# Patient Record
Sex: Female | Born: 1960 | Race: White | Hispanic: No | Marital: Married | Smoking: Never smoker
Health system: Southern US, Community
[De-identification: ages and names within clinical notes are randomized; demographics above are authoritative.]

## PROBLEM LIST (undated history)

## (undated) DIAGNOSIS — E785 Hyperlipidemia, unspecified: Secondary | ICD-10-CM

## (undated) HISTORY — DX: Hyperlipidemia, unspecified: E78.5

---

## 2011-01-18 ENCOUNTER — Other Ambulatory Visit (HOSPITAL_BASED_OUTPATIENT_CLINIC_OR_DEPARTMENT_OTHER): Payer: Self-pay | Admitting: Family Medicine

## 2011-01-18 DIAGNOSIS — R599 Enlarged lymph nodes, unspecified: Secondary | ICD-10-CM

## 2011-01-19 ENCOUNTER — Ambulatory Visit (HOSPITAL_BASED_OUTPATIENT_CLINIC_OR_DEPARTMENT_OTHER)
Admission: RE | Admit: 2011-01-19 | Discharge: 2011-01-19 | Disposition: A | Discharge status: Home / Self Care | Payer: Private Health Insurance - Indemnity | Source: Ambulatory Visit | Attending: Family Medicine | Admitting: Family Medicine

## 2011-01-19 DIAGNOSIS — E049 Nontoxic goiter, unspecified: Secondary | ICD-10-CM | POA: Insufficient documentation

## 2011-01-19 DIAGNOSIS — R220 Localized swelling, mass and lump, head: Secondary | ICD-10-CM

## 2011-01-19 DIAGNOSIS — R599 Enlarged lymph nodes, unspecified: Secondary | ICD-10-CM | POA: Insufficient documentation

## 2011-01-19 DIAGNOSIS — E042 Nontoxic multinodular goiter: Secondary | ICD-10-CM

## 2018-10-07 DIAGNOSIS — G35 Multiple sclerosis: Secondary | ICD-10-CM | POA: Diagnosis not present

## 2018-10-15 DIAGNOSIS — M5417 Radiculopathy, lumbosacral region: Secondary | ICD-10-CM | POA: Diagnosis not present

## 2018-10-15 DIAGNOSIS — G35 Multiple sclerosis: Secondary | ICD-10-CM | POA: Diagnosis not present

## 2018-10-15 DIAGNOSIS — Z79899 Other long term (current) drug therapy: Secondary | ICD-10-CM | POA: Diagnosis not present

## 2018-10-15 DIAGNOSIS — G43909 Migraine, unspecified, not intractable, without status migrainosus: Secondary | ICD-10-CM | POA: Diagnosis not present

## 2018-10-15 DIAGNOSIS — G2581 Restless legs syndrome: Secondary | ICD-10-CM | POA: Diagnosis not present

## 2018-10-15 DIAGNOSIS — G500 Trigeminal neuralgia: Secondary | ICD-10-CM | POA: Diagnosis not present

## 2018-10-23 DIAGNOSIS — Z8041 Family history of malignant neoplasm of ovary: Secondary | ICD-10-CM | POA: Diagnosis not present

## 2018-10-23 DIAGNOSIS — Z90710 Acquired absence of both cervix and uterus: Secondary | ICD-10-CM | POA: Diagnosis not present

## 2018-10-23 DIAGNOSIS — Z01419 Encounter for gynecological examination (general) (routine) without abnormal findings: Secondary | ICD-10-CM | POA: Diagnosis not present

## 2018-10-23 DIAGNOSIS — Z808 Family history of malignant neoplasm of other organs or systems: Secondary | ICD-10-CM | POA: Diagnosis not present

## 2018-11-05 DIAGNOSIS — G35 Multiple sclerosis: Secondary | ICD-10-CM | POA: Diagnosis not present

## 2018-11-17 DIAGNOSIS — H2513 Age-related nuclear cataract, bilateral: Secondary | ICD-10-CM | POA: Diagnosis not present

## 2018-11-17 DIAGNOSIS — H10413 Chronic giant papillary conjunctivitis, bilateral: Secondary | ICD-10-CM | POA: Diagnosis not present

## 2018-11-17 DIAGNOSIS — G35 Multiple sclerosis: Secondary | ICD-10-CM | POA: Diagnosis not present

## 2018-12-03 DIAGNOSIS — G35 Multiple sclerosis: Secondary | ICD-10-CM | POA: Diagnosis not present

## 2018-12-23 DIAGNOSIS — D229 Melanocytic nevi, unspecified: Secondary | ICD-10-CM | POA: Diagnosis not present

## 2018-12-23 DIAGNOSIS — L570 Actinic keratosis: Secondary | ICD-10-CM | POA: Diagnosis not present

## 2018-12-29 DIAGNOSIS — Z20828 Contact with and (suspected) exposure to other viral communicable diseases: Secondary | ICD-10-CM | POA: Diagnosis not present

## 2019-01-05 DIAGNOSIS — Z20828 Contact with and (suspected) exposure to other viral communicable diseases: Secondary | ICD-10-CM | POA: Diagnosis not present

## 2019-01-14 DIAGNOSIS — G35 Multiple sclerosis: Secondary | ICD-10-CM | POA: Diagnosis not present

## 2019-01-21 DIAGNOSIS — G2581 Restless legs syndrome: Secondary | ICD-10-CM | POA: Diagnosis not present

## 2019-01-21 DIAGNOSIS — G43909 Migraine, unspecified, not intractable, without status migrainosus: Secondary | ICD-10-CM | POA: Diagnosis not present

## 2019-01-21 DIAGNOSIS — Z79899 Other long term (current) drug therapy: Secondary | ICD-10-CM | POA: Diagnosis not present

## 2019-01-21 DIAGNOSIS — G35 Multiple sclerosis: Secondary | ICD-10-CM | POA: Diagnosis not present

## 2019-01-21 DIAGNOSIS — M5412 Radiculopathy, cervical region: Secondary | ICD-10-CM | POA: Diagnosis not present

## 2019-02-11 DIAGNOSIS — G35 Multiple sclerosis: Secondary | ICD-10-CM | POA: Diagnosis not present

## 2019-02-23 DIAGNOSIS — K219 Gastro-esophageal reflux disease without esophagitis: Secondary | ICD-10-CM | POA: Diagnosis not present

## 2019-02-23 DIAGNOSIS — K581 Irritable bowel syndrome with constipation: Secondary | ICD-10-CM | POA: Diagnosis not present

## 2019-02-23 DIAGNOSIS — R140 Abdominal distension (gaseous): Secondary | ICD-10-CM | POA: Diagnosis not present

## 2019-03-11 DIAGNOSIS — G35 Multiple sclerosis: Secondary | ICD-10-CM | POA: Diagnosis not present

## 2019-04-01 DIAGNOSIS — G35 Multiple sclerosis: Secondary | ICD-10-CM | POA: Diagnosis not present

## 2019-04-07 DIAGNOSIS — L570 Actinic keratosis: Secondary | ICD-10-CM | POA: Diagnosis not present

## 2019-04-13 DIAGNOSIS — Z03818 Encounter for observation for suspected exposure to other biological agents ruled out: Secondary | ICD-10-CM | POA: Diagnosis not present

## 2019-04-17 DIAGNOSIS — Z03818 Encounter for observation for suspected exposure to other biological agents ruled out: Secondary | ICD-10-CM | POA: Diagnosis not present

## 2019-05-04 DIAGNOSIS — Z8371 Family history of colonic polyps: Secondary | ICD-10-CM | POA: Diagnosis not present

## 2019-05-04 DIAGNOSIS — K581 Irritable bowel syndrome with constipation: Secondary | ICD-10-CM | POA: Diagnosis not present

## 2019-05-04 DIAGNOSIS — K219 Gastro-esophageal reflux disease without esophagitis: Secondary | ICD-10-CM | POA: Diagnosis not present

## 2019-05-04 DIAGNOSIS — G35 Multiple sclerosis: Secondary | ICD-10-CM | POA: Diagnosis not present

## 2019-05-06 DIAGNOSIS — G35 Multiple sclerosis: Secondary | ICD-10-CM | POA: Diagnosis not present

## 2019-05-14 DIAGNOSIS — Z79899 Other long term (current) drug therapy: Secondary | ICD-10-CM | POA: Diagnosis not present

## 2019-05-14 DIAGNOSIS — G35 Multiple sclerosis: Secondary | ICD-10-CM | POA: Diagnosis not present

## 2019-05-14 DIAGNOSIS — G5603 Carpal tunnel syndrome, bilateral upper limbs: Secondary | ICD-10-CM | POA: Diagnosis not present

## 2019-05-14 DIAGNOSIS — E538 Deficiency of other specified B group vitamins: Secondary | ICD-10-CM | POA: Diagnosis not present

## 2019-05-14 DIAGNOSIS — D649 Anemia, unspecified: Secondary | ICD-10-CM | POA: Diagnosis not present

## 2019-05-14 DIAGNOSIS — M5412 Radiculopathy, cervical region: Secondary | ICD-10-CM | POA: Diagnosis not present

## 2019-05-14 DIAGNOSIS — M5417 Radiculopathy, lumbosacral region: Secondary | ICD-10-CM | POA: Diagnosis not present

## 2019-05-26 DIAGNOSIS — K219 Gastro-esophageal reflux disease without esophagitis: Secondary | ICD-10-CM | POA: Diagnosis not present

## 2019-05-26 DIAGNOSIS — G35 Multiple sclerosis: Secondary | ICD-10-CM | POA: Diagnosis not present

## 2019-05-26 DIAGNOSIS — G43909 Migraine, unspecified, not intractable, without status migrainosus: Secondary | ICD-10-CM | POA: Diagnosis not present

## 2019-05-26 DIAGNOSIS — E7800 Pure hypercholesterolemia, unspecified: Secondary | ICD-10-CM | POA: Diagnosis not present

## 2019-06-10 DIAGNOSIS — H10413 Chronic giant papillary conjunctivitis, bilateral: Secondary | ICD-10-CM | POA: Diagnosis not present

## 2019-06-10 DIAGNOSIS — H43391 Other vitreous opacities, right eye: Secondary | ICD-10-CM | POA: Diagnosis not present

## 2019-06-10 DIAGNOSIS — G35 Multiple sclerosis: Secondary | ICD-10-CM | POA: Diagnosis not present

## 2019-06-10 DIAGNOSIS — H2513 Age-related nuclear cataract, bilateral: Secondary | ICD-10-CM | POA: Diagnosis not present

## 2019-06-21 DIAGNOSIS — Z20828 Contact with and (suspected) exposure to other viral communicable diseases: Secondary | ICD-10-CM | POA: Diagnosis not present

## 2019-06-22 DIAGNOSIS — Z0000 Encounter for general adult medical examination without abnormal findings: Secondary | ICD-10-CM | POA: Diagnosis not present

## 2019-06-23 DIAGNOSIS — E7800 Pure hypercholesterolemia, unspecified: Secondary | ICD-10-CM | POA: Diagnosis not present

## 2019-06-24 DIAGNOSIS — Z8041 Family history of malignant neoplasm of ovary: Secondary | ICD-10-CM | POA: Diagnosis not present

## 2019-06-24 DIAGNOSIS — Z79899 Other long term (current) drug therapy: Secondary | ICD-10-CM | POA: Diagnosis not present

## 2019-06-24 DIAGNOSIS — D649 Anemia, unspecified: Secondary | ICD-10-CM | POA: Diagnosis not present

## 2019-06-24 DIAGNOSIS — G35 Multiple sclerosis: Secondary | ICD-10-CM | POA: Diagnosis not present

## 2019-07-20 DIAGNOSIS — G35 Multiple sclerosis: Secondary | ICD-10-CM | POA: Diagnosis not present

## 2019-07-21 DIAGNOSIS — H10413 Chronic giant papillary conjunctivitis, bilateral: Secondary | ICD-10-CM | POA: Diagnosis not present

## 2019-07-21 DIAGNOSIS — G35 Multiple sclerosis: Secondary | ICD-10-CM | POA: Diagnosis not present

## 2019-07-21 DIAGNOSIS — H43391 Other vitreous opacities, right eye: Secondary | ICD-10-CM | POA: Diagnosis not present

## 2019-07-21 DIAGNOSIS — H2513 Age-related nuclear cataract, bilateral: Secondary | ICD-10-CM | POA: Diagnosis not present

## 2019-07-22 DIAGNOSIS — D649 Anemia, unspecified: Secondary | ICD-10-CM | POA: Diagnosis not present

## 2019-08-17 DIAGNOSIS — G35 Multiple sclerosis: Secondary | ICD-10-CM | POA: Diagnosis not present

## 2019-08-18 DIAGNOSIS — Z8041 Family history of malignant neoplasm of ovary: Secondary | ICD-10-CM | POA: Diagnosis not present

## 2019-08-21 DIAGNOSIS — G5603 Carpal tunnel syndrome, bilateral upper limbs: Secondary | ICD-10-CM | POA: Diagnosis not present

## 2019-08-21 DIAGNOSIS — M5417 Radiculopathy, lumbosacral region: Secondary | ICD-10-CM | POA: Diagnosis not present

## 2019-08-21 DIAGNOSIS — M5412 Radiculopathy, cervical region: Secondary | ICD-10-CM | POA: Diagnosis not present

## 2019-08-21 DIAGNOSIS — G35 Multiple sclerosis: Secondary | ICD-10-CM | POA: Diagnosis not present

## 2019-08-21 DIAGNOSIS — U071 COVID-19: Secondary | ICD-10-CM | POA: Diagnosis not present

## 2019-08-21 DIAGNOSIS — Z79899 Other long term (current) drug therapy: Secondary | ICD-10-CM | POA: Diagnosis not present

## 2019-09-14 DIAGNOSIS — G35 Multiple sclerosis: Secondary | ICD-10-CM | POA: Diagnosis not present

## 2019-10-12 DIAGNOSIS — G35 Multiple sclerosis: Secondary | ICD-10-CM | POA: Diagnosis not present

## 2019-10-28 DIAGNOSIS — N951 Menopausal and female climacteric states: Secondary | ICD-10-CM | POA: Diagnosis not present

## 2019-10-28 DIAGNOSIS — Z1231 Encounter for screening mammogram for malignant neoplasm of breast: Secondary | ICD-10-CM | POA: Diagnosis not present

## 2019-10-28 DIAGNOSIS — Z1151 Encounter for screening for human papillomavirus (HPV): Secondary | ICD-10-CM | POA: Diagnosis not present

## 2019-10-28 DIAGNOSIS — Z01419 Encounter for gynecological examination (general) (routine) without abnormal findings: Secondary | ICD-10-CM | POA: Diagnosis not present

## 2019-11-17 DIAGNOSIS — G35 Multiple sclerosis: Secondary | ICD-10-CM | POA: Diagnosis not present

## 2019-11-24 DIAGNOSIS — Z1231 Encounter for screening mammogram for malignant neoplasm of breast: Secondary | ICD-10-CM | POA: Diagnosis not present

## 2019-12-01 DIAGNOSIS — E7800 Pure hypercholesterolemia, unspecified: Secondary | ICD-10-CM | POA: Diagnosis not present

## 2019-12-01 DIAGNOSIS — K219 Gastro-esophageal reflux disease without esophagitis: Secondary | ICD-10-CM | POA: Diagnosis not present

## 2019-12-01 DIAGNOSIS — G43909 Migraine, unspecified, not intractable, without status migrainosus: Secondary | ICD-10-CM | POA: Diagnosis not present

## 2019-12-01 DIAGNOSIS — G35 Multiple sclerosis: Secondary | ICD-10-CM | POA: Diagnosis not present

## 2019-12-04 DIAGNOSIS — Z79899 Other long term (current) drug therapy: Secondary | ICD-10-CM | POA: Diagnosis not present

## 2019-12-04 DIAGNOSIS — M5417 Radiculopathy, lumbosacral region: Secondary | ICD-10-CM | POA: Diagnosis not present

## 2019-12-04 DIAGNOSIS — G35 Multiple sclerosis: Secondary | ICD-10-CM | POA: Diagnosis not present

## 2019-12-04 DIAGNOSIS — G43909 Migraine, unspecified, not intractable, without status migrainosus: Secondary | ICD-10-CM | POA: Diagnosis not present

## 2019-12-04 DIAGNOSIS — M5412 Radiculopathy, cervical region: Secondary | ICD-10-CM | POA: Diagnosis not present

## 2019-12-09 DIAGNOSIS — G35 Multiple sclerosis: Secondary | ICD-10-CM | POA: Diagnosis not present

## 2019-12-09 DIAGNOSIS — M5417 Radiculopathy, lumbosacral region: Secondary | ICD-10-CM | POA: Diagnosis not present

## 2019-12-09 DIAGNOSIS — G500 Trigeminal neuralgia: Secondary | ICD-10-CM | POA: Diagnosis not present

## 2019-12-09 DIAGNOSIS — G43719 Chronic migraine without aura, intractable, without status migrainosus: Secondary | ICD-10-CM | POA: Diagnosis not present

## 2019-12-15 DIAGNOSIS — G35 Multiple sclerosis: Secondary | ICD-10-CM | POA: Diagnosis not present

## 2020-01-12 DIAGNOSIS — G35 Multiple sclerosis: Secondary | ICD-10-CM | POA: Diagnosis not present

## 2020-01-25 DIAGNOSIS — D649 Anemia, unspecified: Secondary | ICD-10-CM | POA: Diagnosis not present

## 2020-02-08 ENCOUNTER — Ambulatory Visit (INDEPENDENT_AMBULATORY_CARE_PROVIDER_SITE_OTHER): Payer: BLUE CROSS/BLUE SHIELD | Admitting: Dermatology

## 2020-02-08 ENCOUNTER — Encounter: Payer: Self-pay | Admitting: Dermatology

## 2020-02-08 ENCOUNTER — Other Ambulatory Visit: Payer: Self-pay

## 2020-02-08 DIAGNOSIS — L570 Actinic keratosis: Secondary | ICD-10-CM

## 2020-02-08 DIAGNOSIS — D22121 Melanocytic nevi of left upper eyelid, including canthus: Secondary | ICD-10-CM

## 2020-02-08 DIAGNOSIS — D229 Melanocytic nevi, unspecified: Secondary | ICD-10-CM

## 2020-02-08 MED ORDER — IVERMECTIN 0.5 % EX LOTN: TOPICAL_LOTION | CUTANEOUS | 0 refills | Status: DC

## 2020-02-08 NOTE — Patient Instructions (Addendum)
Follow-up visit for Melissa Hicks date of birth Aug 29, 1960.  The biopsy from the left inner brow showed a type of benign below the top skin mole: Intradermal nevus; the residual bump currently does not require repeat biopsy or further removal.  Our mutual frustration is the persistence of crusting on the right nostril; biopsy 2 years ago showed a mixed actinic plus seborrheic keratosis and dermoscopy today does not suggest skin cancer.  The spot along with 2 crusts in each brow will be treated with Klisyri which is nightly application for 5 nights.  If this is not a covered medication or an unacceptable out-of-pocket expense, I have asked Tamera to refuse the medication and she will return to repeat freezing.  Ultimately if the right nostril continues to be refractory to simple therapy, rebiopsy will be indicated.  Follow-up scheduled early December.

## 2020-02-09 DIAGNOSIS — G35 Multiple sclerosis: Secondary | ICD-10-CM | POA: Diagnosis not present

## 2020-02-16 DIAGNOSIS — H903 Sensorineural hearing loss, bilateral: Secondary | ICD-10-CM | POA: Diagnosis not present

## 2020-03-13 ENCOUNTER — Encounter: Payer: Self-pay | Admitting: Dermatology

## 2020-03-13 NOTE — Progress Notes (Signed)
   Melissa Visit   Subjective  Melissa Hicks is a 59 y.o. female who presents for the following: Melissa (RIGHT NOSE, MID CHEST RE CHECK ISK).  Crust Location: Nostril Duration:  Quality:  Associated Signs/Symptoms: Modifying Factors:  Severity:  Timing: Context:   Objective  Well appearing patient in no apparent distress; mood and affect are within normal limits.  A focused examination was performed including Head and neck.. Relevant physical exam findings are noted in the Assessment and Plan.   Assessment & Plan    AK (actinic keratosis) Right Tip of Nose  Klisyri nightly x5 along with new similar lesions on the eyebrows.  Biopsy if this fails  Nevus Left Upper Eyelid  Leave if stable.   Melissa visit for Melissa Hicks date of birth Jan 20, 1961.  The biopsy from the left inner brow showed a type of benign below the top skin mole: Intradermal nevus; the residual bump currently does not require repeat biopsy or further removal.  Our mutual frustration is the persistence of crusting on the right nostril; biopsy 2 years ago showed a mixed actinic plus seborrheic keratosis and dermoscopy today does not suggest skin cancer.  The spot along with 2 crusts in each brow will be treated with Klisyri which is nightly application for 5 nights.  If this is not a covered medication or an unacceptable out-of-pocket expense, I have asked Melissa Hicks to refuse the medication and she will return to repeat freezing.  Ultimately if the right nostril continues to be refractory to simple therapy, rebiopsy will be indicated.  Melissa scheduled early December.   I, [name], MD, have reviewed all documentation for this visit.  The documentation on 03/13/20 for the exam, diagnosis, procedures, and orders are all accurate and complete.

## 2020-03-14 DIAGNOSIS — Z03818 Encounter for observation for suspected exposure to other biological agents ruled out: Secondary | ICD-10-CM | POA: Diagnosis not present

## 2020-03-14 DIAGNOSIS — Z1152 Encounter for screening for COVID-19: Secondary | ICD-10-CM | POA: Diagnosis not present

## 2020-03-14 DIAGNOSIS — J029 Acute pharyngitis, unspecified: Secondary | ICD-10-CM | POA: Diagnosis not present

## 2020-03-23 DIAGNOSIS — G35 Multiple sclerosis: Secondary | ICD-10-CM | POA: Diagnosis not present

## 2020-04-05 DIAGNOSIS — G35 Multiple sclerosis: Secondary | ICD-10-CM | POA: Diagnosis not present

## 2020-04-05 DIAGNOSIS — M5417 Radiculopathy, lumbosacral region: Secondary | ICD-10-CM | POA: Diagnosis not present

## 2020-04-05 DIAGNOSIS — Z79899 Other long term (current) drug therapy: Secondary | ICD-10-CM | POA: Diagnosis not present

## 2020-04-05 DIAGNOSIS — G43909 Migraine, unspecified, not intractable, without status migrainosus: Secondary | ICD-10-CM | POA: Diagnosis not present

## 2020-04-05 DIAGNOSIS — G500 Trigeminal neuralgia: Secondary | ICD-10-CM | POA: Diagnosis not present

## 2020-04-13 ENCOUNTER — Ambulatory Visit: Payer: BLUE CROSS/BLUE SHIELD | Admitting: Dermatology

## 2020-04-20 DIAGNOSIS — G35 Multiple sclerosis: Secondary | ICD-10-CM | POA: Diagnosis not present

## 2020-04-22 DIAGNOSIS — G35 Multiple sclerosis: Secondary | ICD-10-CM | POA: Diagnosis not present

## 2020-05-16 DIAGNOSIS — R0981 Nasal congestion: Secondary | ICD-10-CM | POA: Diagnosis not present

## 2020-05-16 DIAGNOSIS — R070 Pain in throat: Secondary | ICD-10-CM | POA: Diagnosis not present

## 2020-05-16 DIAGNOSIS — R509 Fever, unspecified: Secondary | ICD-10-CM | POA: Diagnosis not present

## 2020-05-16 DIAGNOSIS — R519 Headache, unspecified: Secondary | ICD-10-CM | POA: Diagnosis not present

## 2020-05-17 ENCOUNTER — Ambulatory Visit: Payer: BLUE CROSS/BLUE SHIELD | Admitting: Dermatology

## 2020-05-30 DIAGNOSIS — Z1152 Encounter for screening for COVID-19: Secondary | ICD-10-CM | POA: Diagnosis not present

## 2020-05-31 DIAGNOSIS — G35 Multiple sclerosis: Secondary | ICD-10-CM | POA: Diagnosis not present

## 2020-06-21 DIAGNOSIS — D124 Benign neoplasm of descending colon: Secondary | ICD-10-CM | POA: Diagnosis not present

## 2020-06-21 DIAGNOSIS — Z86010 Personal history of colonic polyps: Secondary | ICD-10-CM | POA: Diagnosis not present

## 2020-06-21 DIAGNOSIS — K649 Unspecified hemorrhoids: Secondary | ICD-10-CM | POA: Diagnosis not present

## 2020-06-21 DIAGNOSIS — K635 Polyp of colon: Secondary | ICD-10-CM | POA: Diagnosis not present

## 2020-06-28 DIAGNOSIS — G35 Multiple sclerosis: Secondary | ICD-10-CM | POA: Diagnosis not present

## 2020-07-06 DIAGNOSIS — Z79899 Other long term (current) drug therapy: Secondary | ICD-10-CM | POA: Diagnosis not present

## 2020-07-06 DIAGNOSIS — M5412 Radiculopathy, cervical region: Secondary | ICD-10-CM | POA: Diagnosis not present

## 2020-07-06 DIAGNOSIS — G35 Multiple sclerosis: Secondary | ICD-10-CM | POA: Diagnosis not present

## 2020-07-06 DIAGNOSIS — M5417 Radiculopathy, lumbosacral region: Secondary | ICD-10-CM | POA: Diagnosis not present

## 2020-07-26 DIAGNOSIS — G35 Multiple sclerosis: Secondary | ICD-10-CM | POA: Diagnosis not present

## 2020-07-27 DIAGNOSIS — D649 Anemia, unspecified: Secondary | ICD-10-CM | POA: Diagnosis not present

## 2020-08-02 ENCOUNTER — Ambulatory Visit: Payer: BC Managed Care – PPO | Admitting: Dermatology

## 2020-08-29 DIAGNOSIS — G35 Multiple sclerosis: Secondary | ICD-10-CM | POA: Diagnosis not present

## 2020-09-26 ENCOUNTER — Encounter: Payer: Self-pay | Admitting: Dermatology

## 2020-09-26 ENCOUNTER — Other Ambulatory Visit: Payer: Self-pay

## 2020-09-26 ENCOUNTER — Ambulatory Visit (INDEPENDENT_AMBULATORY_CARE_PROVIDER_SITE_OTHER): Payer: BC Managed Care – PPO | Admitting: Dermatology

## 2020-09-26 DIAGNOSIS — C4359 Malignant melanoma of other part of trunk: Secondary | ICD-10-CM

## 2020-09-26 DIAGNOSIS — D485 Neoplasm of uncertain behavior of skin: Secondary | ICD-10-CM

## 2020-09-26 DIAGNOSIS — C439 Malignant melanoma of skin, unspecified: Secondary | ICD-10-CM

## 2020-09-26 DIAGNOSIS — Z1283 Encounter for screening for malignant neoplasm of skin: Secondary | ICD-10-CM | POA: Diagnosis not present

## 2020-09-26 DIAGNOSIS — L570 Actinic keratosis: Secondary | ICD-10-CM | POA: Diagnosis not present

## 2020-09-26 HISTORY — DX: Malignant melanoma of skin, unspecified: C43.9

## 2020-09-26 NOTE — Patient Instructions (Signed)

## 2020-09-30 ENCOUNTER — Telehealth (INDEPENDENT_AMBULATORY_CARE_PROVIDER_SITE_OTHER): Payer: BC Managed Care – PPO | Admitting: Dermatology

## 2020-09-30 NOTE — Telephone Encounter (Signed)
I told her Melissa Hicks in some detail the nature of the biopsy of level 2 melanoma with need for roughly a 1 cm excisional around the spot.  They have a good friend who is a Psychiatric nurse in [place] and I think this would be a totally appropriate plan for removal.  She will contact my office next week and leave the information about the doctor and we will forward the pathology records.  I will plan on doing her care after the surgery is complete.  She has my cell phone number if she has any questions relating to this.

## 2020-10-04 ENCOUNTER — Telehealth: Payer: Self-pay | Admitting: Dermatology

## 2020-10-04 NOTE — Telephone Encounter (Signed)
She spoke to Douglass, knows her biopsy results, and has appointment this Thursday @10 :30 with Dr. Trudie Reed @ [facility] and requested that we send path report to him

## 2020-10-06 DIAGNOSIS — M1990 Unspecified osteoarthritis, unspecified site: Secondary | ICD-10-CM | POA: Diagnosis not present

## 2020-10-06 DIAGNOSIS — C4359 Malignant melanoma of other part of trunk: Secondary | ICD-10-CM | POA: Diagnosis not present

## 2020-10-06 DIAGNOSIS — K219 Gastro-esophageal reflux disease without esophagitis: Secondary | ICD-10-CM | POA: Diagnosis not present

## 2020-10-06 DIAGNOSIS — G35 Multiple sclerosis: Secondary | ICD-10-CM | POA: Diagnosis not present

## 2020-10-07 DIAGNOSIS — C4359 Malignant melanoma of other part of trunk: Secondary | ICD-10-CM | POA: Diagnosis not present

## 2020-10-07 DIAGNOSIS — C4361 Malignant melanoma of right upper limb, including shoulder: Secondary | ICD-10-CM | POA: Diagnosis not present

## 2020-10-10 ENCOUNTER — Encounter: Payer: Self-pay | Admitting: Dermatology

## 2020-10-10 NOTE — Progress Notes (Signed)
   Follow-Up Visit   Subjective  Melissa Hicks is a 60 y.o. female who presents for the following: Follow-up (Follow up pdt no help also klisyri discussed at last visit no change in crust patient stated possible ln2 for face, also check new lesion x weeks on upper back).  Persistent crust right forehead, new dark spot right shoulder Location:  Duration:  Quality:  Associated Signs/Symptoms: Modifying Factors: Klisyri did not clear spot on forehead Severity:  Timing: Context:   Objective  Well appearing patient in no apparent distress; mood and affect are within normal limits. Objective  Right Upper Back: 6 mm irregular tri-chromic macule, dermoscopy suggests superficial spreading melanoma patient patient was specifically told before the biopsy that there was a likelihood that this was a melanoma and that biopsy was essential.     Objective  Mid Forehead: 3 mm gritty pink crust  Objective  Mid Back: Abbreviated general skin examination (areas beneath undergarments not examined).  1 possible melanoma right upper back will be biopsied.    A focused examination was performed including Head, neck, arms, back, upper chest.. Relevant physical exam findings are noted in the Assessment and Plan.   Assessment & Plan    Neoplasm of uncertain behavior of skin Right Upper Back  Skin / nail biopsy Type of biopsy: tangential   Informed consent: discussed and consent obtained   Timeout: patient name, date of birth, surgical site, and procedure verified   Anesthesia: the lesion was anesthetized in a standard fashion   Anesthetic:  1% lidocaine w/ epinephrine 1-100,000 local infiltration Instrument used: flexible razor blade   Hemostasis achieved with: aluminum chloride and electrodesiccation   Outcome: patient tolerated procedure well   Post-procedure details: wound care instructions given    Specimen 1 - Surgical pathology Differential Diagnosis: atypia   Check Margins:  yes  AK (actinic keratosis) Mid Forehead  Destruction of lesion - Mid Forehead Complexity: simple   Destruction method: cryotherapy   Informed consent: discussed and consent obtained   Lesion destroyed using liquid nitrogen: Yes   Cryotherapy cycles:  3 Outcome: patient tolerated procedure well with no complications    Encounter for screening for malignant neoplasm of skin Mid Back  Annual skin examination.  Encouraged to self examine twice annually.  Continued ultraviolet protection.      I, [name], MD, have reviewed all documentation for this visit.  The documentation on 10/10/20 for the exam, diagnosis, procedures, and orders are all accurate and complete.

## 2020-10-18 DIAGNOSIS — Z79899 Other long term (current) drug therapy: Secondary | ICD-10-CM | POA: Diagnosis not present

## 2020-10-18 DIAGNOSIS — G35 Multiple sclerosis: Secondary | ICD-10-CM | POA: Diagnosis not present

## 2020-10-18 DIAGNOSIS — G500 Trigeminal neuralgia: Secondary | ICD-10-CM | POA: Diagnosis not present

## 2020-10-18 DIAGNOSIS — M62830 Muscle spasm of back: Secondary | ICD-10-CM | POA: Diagnosis not present

## 2020-10-18 DIAGNOSIS — G43909 Migraine, unspecified, not intractable, without status migrainosus: Secondary | ICD-10-CM | POA: Diagnosis not present

## 2020-10-26 ENCOUNTER — Telehealth: Payer: Self-pay | Admitting: Dermatology

## 2020-10-26 NOTE — Telephone Encounter (Signed)
Patient Left VM asking for update on cream that ST was going to try to get samples of for her face (she was unsure of the name of medication) She is calling to see if we were able to get those or what the next step is if no samples were received.

## 2020-11-02 DIAGNOSIS — Z01419 Encounter for gynecological examination (general) (routine) without abnormal findings: Secondary | ICD-10-CM | POA: Diagnosis not present

## 2020-11-02 DIAGNOSIS — N951 Menopausal and female climacteric states: Secondary | ICD-10-CM | POA: Diagnosis not present

## 2020-11-16 DIAGNOSIS — G35 Multiple sclerosis: Secondary | ICD-10-CM | POA: Diagnosis not present

## 2020-11-24 DIAGNOSIS — Z90710 Acquired absence of both cervix and uterus: Secondary | ICD-10-CM | POA: Diagnosis not present

## 2020-11-24 DIAGNOSIS — Z85820 Personal history of malignant melanoma of skin: Secondary | ICD-10-CM | POA: Diagnosis not present

## 2020-11-24 DIAGNOSIS — Z1321 Encounter for screening for nutritional disorder: Secondary | ICD-10-CM | POA: Diagnosis not present

## 2020-11-24 DIAGNOSIS — Z13220 Encounter for screening for lipoid disorders: Secondary | ICD-10-CM | POA: Diagnosis not present

## 2020-11-24 DIAGNOSIS — Z79890 Hormone replacement therapy (postmenopausal): Secondary | ICD-10-CM | POA: Diagnosis not present

## 2020-11-24 DIAGNOSIS — G35 Multiple sclerosis: Secondary | ICD-10-CM | POA: Diagnosis not present

## 2020-11-24 DIAGNOSIS — Z808 Family history of malignant neoplasm of other organs or systems: Secondary | ICD-10-CM | POA: Diagnosis not present

## 2020-11-24 DIAGNOSIS — R7989 Other specified abnormal findings of blood chemistry: Secondary | ICD-10-CM | POA: Diagnosis not present

## 2020-11-24 DIAGNOSIS — Z90722 Acquired absence of ovaries, bilateral: Secondary | ICD-10-CM | POA: Diagnosis not present

## 2020-11-24 DIAGNOSIS — Z0001 Encounter for general adult medical examination with abnormal findings: Secondary | ICD-10-CM | POA: Diagnosis not present

## 2020-11-24 DIAGNOSIS — Z79899 Other long term (current) drug therapy: Secondary | ICD-10-CM | POA: Diagnosis not present

## 2020-11-24 DIAGNOSIS — Z1329 Encounter for screening for other suspected endocrine disorder: Secondary | ICD-10-CM | POA: Diagnosis not present

## 2020-11-24 DIAGNOSIS — N951 Menopausal and female climacteric states: Secondary | ICD-10-CM | POA: Diagnosis not present

## 2020-11-24 DIAGNOSIS — Z8041 Family history of malignant neoplasm of ovary: Secondary | ICD-10-CM | POA: Diagnosis not present

## 2020-12-08 ENCOUNTER — Telehealth: Payer: Self-pay | Admitting: Dermatology

## 2020-12-08 NOTE — Telephone Encounter (Signed)
Patient came by the office saying that she was told she could pick up samples of Klysiri.  Per [name], M.D. patient was given 2 samples of Klysiri.

## 2020-12-08 NOTE — Telephone Encounter (Signed)
Left message for patient to call back about samples of klysiri.

## 2020-12-19 DIAGNOSIS — G35 Multiple sclerosis: Secondary | ICD-10-CM | POA: Diagnosis not present

## 2021-01-10 DIAGNOSIS — Z1231 Encounter for screening mammogram for malignant neoplasm of breast: Secondary | ICD-10-CM | POA: Diagnosis not present

## 2021-01-16 DIAGNOSIS — G35 Multiple sclerosis: Secondary | ICD-10-CM | POA: Diagnosis not present

## 2021-01-25 ENCOUNTER — Ambulatory Visit: Payer: BC Managed Care – PPO | Admitting: Dermatology

## 2021-01-30 ENCOUNTER — Encounter: Payer: Self-pay | Admitting: Dermatology

## 2021-01-31 DIAGNOSIS — D649 Anemia, unspecified: Secondary | ICD-10-CM | POA: Diagnosis not present

## 2021-01-31 NOTE — Telephone Encounter (Signed)
Phone call to patient to get her scheduled. Voicemail left for patient to give the office a call back.

## 2021-02-01 DIAGNOSIS — D529 Folate deficiency anemia, unspecified: Secondary | ICD-10-CM | POA: Diagnosis not present

## 2021-02-01 DIAGNOSIS — G43909 Migraine, unspecified, not intractable, without status migrainosus: Secondary | ICD-10-CM | POA: Diagnosis not present

## 2021-02-01 DIAGNOSIS — G35 Multiple sclerosis: Secondary | ICD-10-CM | POA: Diagnosis not present

## 2021-02-01 DIAGNOSIS — D649 Anemia, unspecified: Secondary | ICD-10-CM | POA: Diagnosis not present

## 2021-02-01 DIAGNOSIS — E559 Vitamin D deficiency, unspecified: Secondary | ICD-10-CM | POA: Diagnosis not present

## 2021-02-01 DIAGNOSIS — M5417 Radiculopathy, lumbosacral region: Secondary | ICD-10-CM | POA: Diagnosis not present

## 2021-02-01 DIAGNOSIS — G4726 Circadian rhythm sleep disorder, shift work type: Secondary | ICD-10-CM | POA: Diagnosis not present

## 2021-02-01 DIAGNOSIS — Z79899 Other long term (current) drug therapy: Secondary | ICD-10-CM | POA: Diagnosis not present

## 2021-02-06 NOTE — Telephone Encounter (Signed)
Patient returned our phone call and was given an appointment for tomorrow afternoon.

## 2021-02-07 ENCOUNTER — Ambulatory Visit (INDEPENDENT_AMBULATORY_CARE_PROVIDER_SITE_OTHER): Payer: BC Managed Care – PPO | Admitting: Dermatology

## 2021-02-07 ENCOUNTER — Encounter: Payer: Self-pay | Admitting: Dermatology

## 2021-02-07 ENCOUNTER — Other Ambulatory Visit: Payer: Self-pay

## 2021-02-07 DIAGNOSIS — D0461 Carcinoma in situ of skin of right upper limb, including shoulder: Secondary | ICD-10-CM

## 2021-02-07 DIAGNOSIS — L309 Dermatitis, unspecified: Secondary | ICD-10-CM

## 2021-02-07 DIAGNOSIS — C4492 Squamous cell carcinoma of skin, unspecified: Secondary | ICD-10-CM

## 2021-02-07 DIAGNOSIS — B359 Dermatophytosis, unspecified: Secondary | ICD-10-CM

## 2021-02-07 DIAGNOSIS — D485 Neoplasm of uncertain behavior of skin: Secondary | ICD-10-CM

## 2021-02-07 DIAGNOSIS — Z85820 Personal history of malignant melanoma of skin: Secondary | ICD-10-CM | POA: Diagnosis not present

## 2021-02-07 DIAGNOSIS — B354 Tinea corporis: Secondary | ICD-10-CM

## 2021-02-07 HISTORY — DX: Squamous cell carcinoma of skin, unspecified: C44.92

## 2021-02-07 LAB — POCT SKIN KOH

## 2021-02-07 MED ORDER — CLOBETASOL PROP EMOLLIENT BASE 0.05 % EX CREA
TOPICAL_CREAM | CUTANEOUS | 6 refills | Status: DC
Start: 2021-02-07 — End: 2021-07-31

## 2021-02-08 ENCOUNTER — Other Ambulatory Visit: Payer: Self-pay | Admitting: Specialist

## 2021-02-08 DIAGNOSIS — G35 Multiple sclerosis: Secondary | ICD-10-CM

## 2021-02-13 ENCOUNTER — Telehealth: Payer: Self-pay | Admitting: Dermatology

## 2021-02-13 DIAGNOSIS — G35 Multiple sclerosis: Secondary | ICD-10-CM | POA: Diagnosis not present

## 2021-02-13 NOTE — Telephone Encounter (Signed)
-----   Message from [name], MD sent at 02/10/2021  7:29 AM EDT ----- Please inform patient that this is a superficial nonmelanoma skin cancer that is generally treated with a simple scraping procedure.  However, if she prefers to see her friend  the Psychiatric nurse in [place] this would be her choice.

## 2021-02-13 NOTE — Telephone Encounter (Signed)
Path to patient surgery made  

## 2021-02-13 NOTE — Telephone Encounter (Signed)
Patient is calling for pathology results from last visit with Stuart Tafeen, MD 

## 2021-02-26 ENCOUNTER — Encounter: Payer: Self-pay | Admitting: Dermatology

## 2021-02-26 NOTE — Progress Notes (Signed)
   Follow-Up Visit   Subjective  Melissa Hicks is a 60 y.o. female who presents for the following: Skin Problem (Right arm-lesion x may - never heals - just keeps scabbing up).  Nonhealing spot right arm, rash on legs, recheck melanoma site Location:  Duration:  Quality:  Associated Signs/Symptoms: Modifying Factors:  Severity:  Timing: Context:   Objective  Well appearing patient in no apparent distress; mood and affect are within normal limits. Right Forearm - Anterior Waxy 8 mm crust compatible with superficial carcinoma       Right Upper Back Patient had a difficult time after her surgery with localized bleeding and pain.  Now lesion shows a clean flap with no sign of repigmentation.  Left Lower Leg - Anterior None marginated 1 cm patchy chronic dermatitis, KOH negative.  Fungal culture obtained.    A focused examination was performed including head, neck, back, arms, legs.. Relevant physical exam findings are noted in the Assessment and Plan.   Assessment & Plan    Neoplasm of uncertain behavior of skin Right Forearm - Anterior  Skin / nail biopsy Type of biopsy: tangential   Informed consent: discussed and consent obtained   Timeout: patient name, date of birth, surgical site, and procedure verified   Procedure prep:  Patient was prepped and draped in usual sterile fashion (Non sterile) Prep type:  Chlorhexidine Anesthesia: the lesion was anesthetized in a standard fashion   Anesthetic:  1% lidocaine w/ epinephrine 1-100,000 local infiltration Instrument used: flexible razor blade   Outcome: patient tolerated procedure well   Post-procedure details: wound care instructions given    Specimen 1 - Surgical pathology Differential Diagnosis: bcc vs scc  Check Margins: No  Tinea corporis  Eczema, unspecified type Right Lower Leg - Anterior  Clobetasol Prop Emollient Base (CLOBETASOL PROPIONATE E) 0.05 % emollient cream - Right Lower Leg -  Anterior Apply to affected area qd  Personal history of malignant melanoma of skin Right Upper Back  Annual general skin examination.  Dermatitis Left Lower Leg - Anterior  Initially treated as nummular eczema with topical clobetasol pending culture.  POCT Skin KOH - Left Lower Leg - Anterior  Culture, fungus without smear - Left Lower Leg - Anterior      I, Melissa Monarch, MD, have reviewed all documentation for this visit.  The documentation on 02/26/21 for the exam, diagnosis, procedures, and orders are all accurate and complete.

## 2021-03-01 ENCOUNTER — Ambulatory Visit
Admission: RE | Admit: 2021-03-01 | Discharge: 2021-03-01 | Disposition: A | Discharge status: Home / Self Care | Payer: BC Managed Care – PPO | Source: Ambulatory Visit | Attending: Specialist | Admitting: Specialist

## 2021-03-01 ENCOUNTER — Other Ambulatory Visit: Payer: Self-pay

## 2021-03-01 DIAGNOSIS — G35 Multiple sclerosis: Secondary | ICD-10-CM

## 2021-03-01 DIAGNOSIS — G939 Disorder of brain, unspecified: Secondary | ICD-10-CM | POA: Diagnosis not present

## 2021-03-01 MED ORDER — GADOBENATE DIMEGLUMINE 529 MG/ML IV SOLN
12.0000 mL | Freq: Once | INTRAVENOUS | Status: AC | PRN
  Administered 2021-03-01: 12 mL via INTRAVENOUS

## 2021-03-10 LAB — CULTURE, FUNGUS WITHOUT SMEAR
CULTURE:: NO GROWTH
MICRO NUMBER:: 12464238
SPECIMEN QUALITY:: ADEQUATE

## 2021-03-13 DIAGNOSIS — G35 Multiple sclerosis: Secondary | ICD-10-CM | POA: Diagnosis not present

## 2021-03-16 ENCOUNTER — Encounter: Payer: Self-pay | Admitting: Dermatology

## 2021-03-16 ENCOUNTER — Ambulatory Visit (INDEPENDENT_AMBULATORY_CARE_PROVIDER_SITE_OTHER): Payer: BC Managed Care – PPO | Admitting: Dermatology

## 2021-03-16 ENCOUNTER — Other Ambulatory Visit: Payer: Self-pay

## 2021-03-16 DIAGNOSIS — D0461 Carcinoma in situ of skin of right upper limb, including shoulder: Secondary | ICD-10-CM

## 2021-03-16 NOTE — Patient Instructions (Signed)

## 2021-03-28 DIAGNOSIS — J069 Acute upper respiratory infection, unspecified: Secondary | ICD-10-CM | POA: Diagnosis not present

## 2021-03-28 DIAGNOSIS — Z03818 Encounter for observation for suspected exposure to other biological agents ruled out: Secondary | ICD-10-CM | POA: Diagnosis not present

## 2021-04-09 ENCOUNTER — Encounter: Payer: Self-pay | Admitting: Dermatology

## 2021-04-09 NOTE — Progress Notes (Signed)
   Follow-Up Visit   Subjective  Melissa Hicks is a 60 y.o. female who presents for the following: Procedure (Here treatment for right forearm- anterior - cis x 1).  CIS right arm Location:  Duration:  Quality:  Associated Signs/Symptoms: Modifying Factors:  Severity:  Timing: Context:   Objective  Well appearing patient in no apparent distress; mood and affect are within normal limits. Right Forearm - Anterior Biopsy site identified by patient, nurse, may    A focused examination was performed including head, neck, arms. Relevant physical exam findings are noted in the Assessment and Plan.   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 Right Forearm - Anterior  Destruction of lesion Complexity: simple   Destruction method: electrodesiccation and curettage   Informed consent: discussed and consent obtained   Timeout:  patient name, date of birth, surgical site, and procedure verified Anesthesia: the lesion was anesthetized in a standard fashion   Anesthetic:  1% lidocaine w/ epinephrine 1-100,000 local infiltration Curettage performed in three different directions: Yes   Curettage cycles:  3 Lesion length (cm):  1.2 Lesion width (cm):  1.2 Margin per side (cm):  0 Final wound size (cm):  1.2 Hemostasis achieved with:  ferric subsulfate Outcome: patient tolerated procedure well with no complications   Additional details:  Wound innoculated with 5 fluorouracil solution.      I, [name], MD, have reviewed all documentation for this visit.  The documentation on 04/09/21 for the exam, diagnosis, procedures, and orders are all accurate and complete.

## 2021-04-17 DIAGNOSIS — J019 Acute sinusitis, unspecified: Secondary | ICD-10-CM | POA: Diagnosis not present

## 2021-04-17 DIAGNOSIS — J209 Acute bronchitis, unspecified: Secondary | ICD-10-CM | POA: Diagnosis not present

## 2021-04-25 ENCOUNTER — Ambulatory Visit: Payer: BC Managed Care – PPO | Admitting: Dermatology

## 2021-04-26 DIAGNOSIS — G35 Multiple sclerosis: Secondary | ICD-10-CM | POA: Diagnosis not present

## 2021-04-26 DIAGNOSIS — G43909 Migraine, unspecified, not intractable, without status migrainosus: Secondary | ICD-10-CM | POA: Diagnosis not present

## 2021-04-26 DIAGNOSIS — Z760 Encounter for issue of repeat prescription: Secondary | ICD-10-CM | POA: Diagnosis not present

## 2021-04-26 DIAGNOSIS — M545 Low back pain, unspecified: Secondary | ICD-10-CM | POA: Diagnosis not present

## 2021-04-26 DIAGNOSIS — Z79899 Other long term (current) drug therapy: Secondary | ICD-10-CM | POA: Diagnosis not present

## 2021-05-03 DIAGNOSIS — G35 Multiple sclerosis: Secondary | ICD-10-CM | POA: Diagnosis not present

## 2021-05-31 DIAGNOSIS — G35 Multiple sclerosis: Secondary | ICD-10-CM | POA: Diagnosis not present

## 2021-06-12 DIAGNOSIS — Z03818 Encounter for observation for suspected exposure to other biological agents ruled out: Secondary | ICD-10-CM | POA: Diagnosis not present

## 2021-06-12 DIAGNOSIS — J069 Acute upper respiratory infection, unspecified: Secondary | ICD-10-CM | POA: Diagnosis not present

## 2021-07-11 DIAGNOSIS — G35 Multiple sclerosis: Secondary | ICD-10-CM | POA: Diagnosis not present

## 2021-07-27 DIAGNOSIS — Z79899 Other long term (current) drug therapy: Secondary | ICD-10-CM | POA: Diagnosis not present

## 2021-07-27 DIAGNOSIS — G35 Multiple sclerosis: Secondary | ICD-10-CM | POA: Diagnosis not present

## 2021-07-27 DIAGNOSIS — D649 Anemia, unspecified: Secondary | ICD-10-CM | POA: Diagnosis not present

## 2021-07-27 DIAGNOSIS — C4359 Malignant melanoma of other part of trunk: Secondary | ICD-10-CM | POA: Diagnosis not present

## 2021-07-31 ENCOUNTER — Encounter: Payer: Self-pay | Admitting: Dermatology

## 2021-07-31 ENCOUNTER — Ambulatory Visit (INDEPENDENT_AMBULATORY_CARE_PROVIDER_SITE_OTHER): Payer: BC Managed Care – PPO | Admitting: Dermatology

## 2021-07-31 ENCOUNTER — Other Ambulatory Visit: Payer: Self-pay

## 2021-07-31 DIAGNOSIS — Z86007 Personal history of in-situ neoplasm of skin: Secondary | ICD-10-CM | POA: Diagnosis not present

## 2021-07-31 DIAGNOSIS — L570 Actinic keratosis: Secondary | ICD-10-CM | POA: Diagnosis not present

## 2021-07-31 DIAGNOSIS — Z1283 Encounter for screening for malignant neoplasm of skin: Secondary | ICD-10-CM

## 2021-07-31 DIAGNOSIS — Z85820 Personal history of malignant melanoma of skin: Secondary | ICD-10-CM | POA: Diagnosis not present

## 2021-08-01 DIAGNOSIS — G43909 Migraine, unspecified, not intractable, without status migrainosus: Secondary | ICD-10-CM | POA: Diagnosis not present

## 2021-08-01 DIAGNOSIS — M545 Low back pain, unspecified: Secondary | ICD-10-CM | POA: Diagnosis not present

## 2021-08-01 DIAGNOSIS — Z79899 Other long term (current) drug therapy: Secondary | ICD-10-CM | POA: Diagnosis not present

## 2021-08-01 DIAGNOSIS — G35 Multiple sclerosis: Secondary | ICD-10-CM | POA: Diagnosis not present

## 2021-08-01 DIAGNOSIS — Z760 Encounter for issue of repeat prescription: Secondary | ICD-10-CM | POA: Diagnosis not present

## 2021-08-01 DIAGNOSIS — G500 Trigeminal neuralgia: Secondary | ICD-10-CM | POA: Diagnosis not present

## 2021-08-16 DIAGNOSIS — G35 Multiple sclerosis: Secondary | ICD-10-CM | POA: Diagnosis not present

## 2021-08-19 ENCOUNTER — Encounter: Payer: Self-pay | Admitting: Dermatology

## 2021-08-19 NOTE — Progress Notes (Signed)
? ?  Follow-Up Visit ?  ?Subjective  ?Melissa Hicks is a 61 y.o. female who presents for the following: Follow-up (Patient here today for follow up on treatment for CIS on right forearm - anterior. Per patient her forearm healed well. Patient states that she was also given samples of Klysiri for her face, patient states that she noticed a little improvement she still has rough areas on her face. ). ? ?Follow-up on carcinoma right arm plus persistent growths on face ?Location:  ?Duration:  ?Quality:  ?Associated Signs/Symptoms: ?Modifying Factors:  ?Severity:  ?Timing: ?Context:  ? ?Objective  ?Well appearing patient in no apparent distress; mood and affect are within normal limits. ?Right Forearm - Anterior ?Scar clear today ? ?Right Upper Back ?Scar clear today. ? ?Waist Up ?Waist up exam today no signs of atypical moles, melanoma or non mole skin cancer.  ? ?Head - Anterior (Face) ?Erythematous patches with gritty scale. ? ? ? ?All skin waist up examined.  Areas beneath undergarments not fully examined. ? ? ?Assessment & Plan  ? ? ?History of squamous cell carcinoma in situ (SCCIS) of skin ?Right Forearm - Anterior ? ?Yearly skin check ? ?Personal history of malignant melanoma of skin ?Right Upper Back ? ?Yearly skin check.  ? ?Skin exam for malignant neoplasm ?Waist Up ? ?Yearly skin check.  ? ?Actinic keratosis ?Head - Anterior (Face) ? ?Patient will decide between PDT and [place] treatment this fall.  ? ? ? ? ? ?I, [name], MD, have reviewed all documentation for this visit.  The documentation on 08/19/21 for the exam, diagnosis, procedures, and orders are all accurate and complete. ?

## 2021-09-13 DIAGNOSIS — G35 Multiple sclerosis: Secondary | ICD-10-CM | POA: Diagnosis not present

## 2021-10-04 ENCOUNTER — Encounter: Payer: Self-pay | Admitting: Dermatology

## 2021-10-11 DIAGNOSIS — G35 Multiple sclerosis: Secondary | ICD-10-CM | POA: Diagnosis not present

## 2021-11-01 DIAGNOSIS — Z79899 Other long term (current) drug therapy: Secondary | ICD-10-CM | POA: Diagnosis not present

## 2021-11-01 DIAGNOSIS — G43909 Migraine, unspecified, not intractable, without status migrainosus: Secondary | ICD-10-CM | POA: Diagnosis not present

## 2021-11-01 DIAGNOSIS — G35 Multiple sclerosis: Secondary | ICD-10-CM | POA: Diagnosis not present

## 2021-11-01 DIAGNOSIS — R300 Dysuria: Secondary | ICD-10-CM | POA: Diagnosis not present

## 2021-11-01 DIAGNOSIS — M5402 Panniculitis affecting regions of neck and back, cervical region: Secondary | ICD-10-CM | POA: Diagnosis not present

## 2021-11-01 DIAGNOSIS — D529 Folate deficiency anemia, unspecified: Secondary | ICD-10-CM | POA: Diagnosis not present

## 2021-11-01 DIAGNOSIS — E559 Vitamin D deficiency, unspecified: Secondary | ICD-10-CM | POA: Diagnosis not present

## 2021-11-01 DIAGNOSIS — M545 Low back pain, unspecified: Secondary | ICD-10-CM | POA: Diagnosis not present

## 2021-11-08 DIAGNOSIS — G35 Multiple sclerosis: Secondary | ICD-10-CM | POA: Diagnosis not present

## 2021-12-01 DIAGNOSIS — K581 Irritable bowel syndrome with constipation: Secondary | ICD-10-CM | POA: Diagnosis not present

## 2021-12-01 DIAGNOSIS — R194 Change in bowel habit: Secondary | ICD-10-CM | POA: Diagnosis not present

## 2021-12-01 DIAGNOSIS — R140 Abdominal distension (gaseous): Secondary | ICD-10-CM | POA: Diagnosis not present

## 2021-12-06 DIAGNOSIS — G35 Multiple sclerosis: Secondary | ICD-10-CM | POA: Diagnosis not present

## 2021-12-08 DIAGNOSIS — R140 Abdominal distension (gaseous): Secondary | ICD-10-CM | POA: Diagnosis not present

## 2021-12-08 DIAGNOSIS — Z8041 Family history of malignant neoplasm of ovary: Secondary | ICD-10-CM | POA: Diagnosis not present

## 2021-12-08 DIAGNOSIS — K5900 Constipation, unspecified: Secondary | ICD-10-CM | POA: Diagnosis not present

## 2021-12-08 DIAGNOSIS — Z9049 Acquired absence of other specified parts of digestive tract: Secondary | ICD-10-CM | POA: Diagnosis not present

## 2021-12-12 DIAGNOSIS — G43909 Migraine, unspecified, not intractable, without status migrainosus: Secondary | ICD-10-CM | POA: Diagnosis not present

## 2021-12-12 DIAGNOSIS — M5412 Radiculopathy, cervical region: Secondary | ICD-10-CM | POA: Diagnosis not present

## 2021-12-12 DIAGNOSIS — G35 Multiple sclerosis: Secondary | ICD-10-CM | POA: Diagnosis not present

## 2021-12-19 DIAGNOSIS — H2513 Age-related nuclear cataract, bilateral: Secondary | ICD-10-CM | POA: Diagnosis not present

## 2021-12-19 DIAGNOSIS — H10413 Chronic giant papillary conjunctivitis, bilateral: Secondary | ICD-10-CM | POA: Diagnosis not present

## 2021-12-19 DIAGNOSIS — G35 Multiple sclerosis: Secondary | ICD-10-CM | POA: Diagnosis not present

## 2021-12-19 DIAGNOSIS — H43391 Other vitreous opacities, right eye: Secondary | ICD-10-CM | POA: Diagnosis not present

## 2021-12-25 DIAGNOSIS — C4359 Malignant melanoma of other part of trunk: Secondary | ICD-10-CM | POA: Diagnosis not present

## 2021-12-28 DIAGNOSIS — G35 Multiple sclerosis: Secondary | ICD-10-CM | POA: Diagnosis not present

## 2022-01-25 DIAGNOSIS — G35 Multiple sclerosis: Secondary | ICD-10-CM | POA: Diagnosis not present

## 2022-01-25 DIAGNOSIS — Z9889 Other specified postprocedural states: Secondary | ICD-10-CM | POA: Diagnosis not present

## 2022-01-25 DIAGNOSIS — D649 Anemia, unspecified: Secondary | ICD-10-CM | POA: Diagnosis not present

## 2022-01-25 DIAGNOSIS — C4359 Malignant melanoma of other part of trunk: Secondary | ICD-10-CM | POA: Diagnosis not present

## 2022-01-25 DIAGNOSIS — Z7969 Long term (current) use of other immunomodulators and immunosuppressants: Secondary | ICD-10-CM | POA: Diagnosis not present

## 2022-01-31 DIAGNOSIS — G43909 Migraine, unspecified, not intractable, without status migrainosus: Secondary | ICD-10-CM | POA: Diagnosis not present

## 2022-01-31 DIAGNOSIS — Z79899 Other long term (current) drug therapy: Secondary | ICD-10-CM | POA: Diagnosis not present

## 2022-01-31 DIAGNOSIS — E559 Vitamin D deficiency, unspecified: Secondary | ICD-10-CM | POA: Diagnosis not present

## 2022-01-31 DIAGNOSIS — M5417 Radiculopathy, lumbosacral region: Secondary | ICD-10-CM | POA: Diagnosis not present

## 2022-01-31 DIAGNOSIS — G35 Multiple sclerosis: Secondary | ICD-10-CM | POA: Diagnosis not present

## 2022-02-02 DIAGNOSIS — D509 Iron deficiency anemia, unspecified: Secondary | ICD-10-CM | POA: Diagnosis not present

## 2022-03-12 DIAGNOSIS — K219 Gastro-esophageal reflux disease without esophagitis: Secondary | ICD-10-CM | POA: Diagnosis not present

## 2022-03-12 DIAGNOSIS — K581 Irritable bowel syndrome with constipation: Secondary | ICD-10-CM | POA: Diagnosis not present

## 2022-03-12 DIAGNOSIS — R140 Abdominal distension (gaseous): Secondary | ICD-10-CM | POA: Diagnosis not present

## 2022-03-12 DIAGNOSIS — R110 Nausea: Secondary | ICD-10-CM | POA: Diagnosis not present

## 2022-03-13 ENCOUNTER — Ambulatory Visit: Payer: BC Managed Care – PPO | Admitting: Dermatology

## 2022-03-13 DIAGNOSIS — Z1231 Encounter for screening mammogram for malignant neoplasm of breast: Secondary | ICD-10-CM | POA: Diagnosis not present

## 2022-03-13 DIAGNOSIS — R140 Abdominal distension (gaseous): Secondary | ICD-10-CM | POA: Diagnosis not present

## 2022-03-13 DIAGNOSIS — Z1151 Encounter for screening for human papillomavirus (HPV): Secondary | ICD-10-CM | POA: Diagnosis not present

## 2022-03-13 DIAGNOSIS — Z9049 Acquired absence of other specified parts of digestive tract: Secondary | ICD-10-CM | POA: Diagnosis not present

## 2022-03-13 DIAGNOSIS — Z862 Personal history of diseases of the blood and blood-forming organs and certain disorders involving the immune mechanism: Secondary | ICD-10-CM | POA: Diagnosis not present

## 2022-03-13 DIAGNOSIS — Z808 Family history of malignant neoplasm of other organs or systems: Secondary | ICD-10-CM | POA: Diagnosis not present

## 2022-03-13 DIAGNOSIS — Z01419 Encounter for gynecological examination (general) (routine) without abnormal findings: Secondary | ICD-10-CM | POA: Diagnosis not present

## 2022-03-13 DIAGNOSIS — C4361 Malignant melanoma of right upper limb, including shoulder: Secondary | ICD-10-CM | POA: Diagnosis not present

## 2022-03-13 DIAGNOSIS — R5383 Other fatigue: Secondary | ICD-10-CM | POA: Diagnosis not present

## 2022-03-13 DIAGNOSIS — Z01411 Encounter for gynecological examination (general) (routine) with abnormal findings: Secondary | ICD-10-CM | POA: Diagnosis not present

## 2022-03-13 DIAGNOSIS — G35 Multiple sclerosis: Secondary | ICD-10-CM | POA: Diagnosis not present

## 2022-03-13 DIAGNOSIS — Z79899 Other long term (current) drug therapy: Secondary | ICD-10-CM | POA: Diagnosis not present

## 2022-03-13 DIAGNOSIS — Z8041 Family history of malignant neoplasm of ovary: Secondary | ICD-10-CM | POA: Diagnosis not present

## 2022-03-13 DIAGNOSIS — Z90722 Acquired absence of ovaries, bilateral: Secondary | ICD-10-CM | POA: Diagnosis not present

## 2022-03-26 DIAGNOSIS — B029 Zoster without complications: Secondary | ICD-10-CM | POA: Diagnosis not present

## 2022-04-06 DIAGNOSIS — Z1231 Encounter for screening mammogram for malignant neoplasm of breast: Secondary | ICD-10-CM | POA: Diagnosis not present

## 2022-04-10 DIAGNOSIS — D649 Anemia, unspecified: Secondary | ICD-10-CM | POA: Diagnosis not present

## 2022-04-10 DIAGNOSIS — E7800 Pure hypercholesterolemia, unspecified: Secondary | ICD-10-CM | POA: Diagnosis not present

## 2022-04-10 DIAGNOSIS — I10 Essential (primary) hypertension: Secondary | ICD-10-CM | POA: Diagnosis not present

## 2022-04-10 DIAGNOSIS — R079 Chest pain, unspecified: Secondary | ICD-10-CM | POA: Diagnosis not present

## 2022-04-10 DIAGNOSIS — K219 Gastro-esophageal reflux disease without esophagitis: Secondary | ICD-10-CM | POA: Diagnosis not present

## 2022-04-16 ENCOUNTER — Encounter: Payer: Self-pay | Admitting: Internal Medicine

## 2022-04-16 ENCOUNTER — Ambulatory Visit: Payer: BC Managed Care – PPO | Admitting: Internal Medicine

## 2022-04-16 VITALS — BP 121/70 | HR 95 | Resp 16 | Ht 65.0 in | Wt 132.2 lb

## 2022-04-16 DIAGNOSIS — R079 Chest pain, unspecified: Secondary | ICD-10-CM | POA: Insufficient documentation

## 2022-04-16 DIAGNOSIS — R0609 Other forms of dyspnea: Secondary | ICD-10-CM | POA: Diagnosis not present

## 2022-04-16 DIAGNOSIS — E782 Mixed hyperlipidemia: Secondary | ICD-10-CM

## 2022-04-16 NOTE — Progress Notes (Unsigned)
Primary Physician/Referring:  Antony Contras, MD  Patient ID: Melissa Hicks, female    DOB: 1961-02-18, 61 y.o.   MRN: [id number]  Chief Complaint  Patient presents with   Chest Pain   Shortness of Breath   New Patient (Initial Visit)   HPI:    Melissa Hicks  is a 61 y.o. female with hyperlipidemia and multiple sclerosis who is here to establish care with cardiology.  Patient states she has been having chest pain and shortness of breath with activity.  She states this has been happening for about a month or 2 now and she has never had anything like this before.  She admits that sometimes the chest pain and shortness of breath can occur at rest and it is not always associated with activity.  She is on a lot of medications for her multiple sclerosis and she thinks this could be contributing.  Patient has never had an echocardiogram or stress test before.  She does not smoke or drink alcohol.  Patient denies cardiac history in herself.  She denies palpitations, diaphoresis, syncope, orthopnea, edema, claudication, PND.  Past Medical History:  Diagnosis Date   Hyperlipidemia    Melanoma ([place]) 09/26/2020   mm level !!- right upper back-([employer] [employer])   Squamous cell carcinoma of skin 02/07/2021   in situ- right forearm-anterior ([id number])   History reviewed. No pertinent surgical history. Family History  Problem Relation Age of Onset   Ovarian cancer Mother    Aplastic anemia Father     Social History   Tobacco Use   Smoking status: Never   Smokeless tobacco: Never  Substance Use Topics   Alcohol use: Never   Marital Status: Married  ROS  Review of Systems  Cardiovascular:  Positive for chest pain and dyspnea on exertion.  Respiratory:  Positive for shortness of breath.    Objective  Blood pressure 121/70, pulse 95, resp. rate 16, height _0  (1.651 m), weight 132 lb 3.2 oz (60 kg), SpO2 100 %. Body mass index is 22 kg/m.     04/16/2022    2:14 PM  Vitals with BMI  Height  _1   Weight 132 lbs 3 oz  BMI 22  Systolic 329  Diastolic 70  Pulse 95     Physical Exam Vitals reviewed.  HENT:     Head: Normocephalic and atraumatic.  Cardiovascular:     Rate and Rhythm: Normal rate and regular rhythm.     Pulses: Normal pulses.     Heart sounds: Normal heart sounds. No murmur heard. Pulmonary:     Effort: Pulmonary effort is normal.     Breath sounds: Normal breath sounds.  Abdominal:     General: Bowel sounds are normal.  Musculoskeletal:     Right lower leg: No edema.     Left lower leg: No edema.  Skin:    General: Skin is warm and dry.  Neurological:     Mental Status: She is alert.     Medications and allergies   Allergies  Allergen Reactions   Depakote [Divalproex Sodium] Anxiety     Medication list after today's encounter   Current Outpatient Medications:    ALPRAZolam (XANAX) 1 MG tablet, Take by mouth., Disp: , Rfl:    aspirin 81 MG chewable tablet, Chew by mouth., Disp: , Rfl:    carbamazepine (TEGRETOL) 200 MG tablet, Take by mouth., Disp: , Rfl:    cyclobenzaprine (FLEXERIL) 10 MG tablet, Take 10 mg by mouth 3 (three)  times daily as needed., Disp: , Rfl:    estradiol (VIVELLE-DOT) 0.05 MG/24HR patch, APPLY 1 PATCH TWICE A WEEK, Disp: , Rfl:    gabapentin (NEURONTIN) 300 MG capsule, Take by mouth., Disp: , Rfl:    Melatonin 3 MG CAPS, Take 1 capsule by mouth at bedtime., Disp: , Rfl:    ondansetron (ZOFRAN) 4 MG tablet, Take 4 mg by mouth every 8 (eight) hours as needed for nausea or vomiting., Disp: , Rfl:    pantoprazole (PROTONIX) 40 MG tablet, Take 40 mg by mouth every morning., Disp: , Rfl:    rosuvastatin (CRESTOR) 10 MG tablet, Take 10 mg by mouth daily., Disp: , Rfl:    ZEPOSIA 0.92 MG CAPS, Take 1 capsule by mouth daily., Disp: , Rfl:   Laboratory examination:   No results found for: "NA", "K", "CO2", "GLUCOSE", "BUN", "CREATININE", "CALCIUM", "EGFR", "GFRNONAA"      No data to display             No data to  display          Lipid Panel No results for input(s): "CHOL", "TRIG", "LDLCALC", "VLDL", "HDL", "CHOLHDL", "LDLDIRECT" in the last 8760 hours.  HEMOGLOBIN A1C No results found for: "HGBA1C", "MPG" TSH No results for input(s): "TSH" in the last 8760 hours.  External labs:     Radiology:    Cardiac Studies:     EKG:   04/16/2022: Sinus Rhythm. Low voltage.  -Nonspecific ST depression   Assessment     ICD-10-CM   1. Chest pain  R07.9 EKG 12-Lead    PCV ECHOCARDIOGRAM COMPLETE    PCV MYOCARDIAL PERFUSION WO LEXISCAN    2. Mixed hyperlipidemia  E78.2 PCV ECHOCARDIOGRAM COMPLETE    PCV MYOCARDIAL PERFUSION WO LEXISCAN    3. DOE (dyspnea on exertion)  R06.09 PCV ECHOCARDIOGRAM COMPLETE    PCV MYOCARDIAL PERFUSION WO LEXISCAN       Orders Placed This Encounter  Procedures   PCV MYOCARDIAL PERFUSION WO LEXISCAN    Standing Status:   Future    Standing Expiration Date:   06/17/2022   EKG 12-Lead   PCV ECHOCARDIOGRAM COMPLETE    Standing Status:   Future    Standing Expiration Date:   04/17/2023    No orders of the defined types were placed in this encounter.   Medications Discontinued During This Encounter  Medication Reason   valACYclovir (VALTREX) 1000 MG tablet Completed Course   citalopram (CELEXA) 10 MG tablet    cyanocobalamin 1000 MCG tablet    natalizumab (TYSABRI) 300 MG/15ML injection    Vitamin D, Ergocalciferol, (DRISDOL) 1.25 MG (50000 UNIT) CAPS capsule    zolmitriptan (ZOMIG) 5 MG nasal solution    Zinc 100 MG TABS    buprenorphine (BUTRANS) 5 MCG/HR PTWK    PROMETHEGAN 25 MG suppository      Recommendations:   Melissa Hicks is a 61 y.o.  female with chest pain   Chest pain Some typical symptoms however chest pain is not regularly associated with activity Patient states her MS drugs have a lot of side effects and she was recently switched to a different medication Stress test ordered   Mixed hyperlipidemia Continue statin Followed  by PCP   DOE (dyspnea on exertion) Echocardiogram ordered Follow-up in 6 weeks or sooner if needed     Melissa Flock, DO, [facility]  04/17/2022, 1:23 PM Office: [phone] Pager: [phone]

## 2022-04-24 DIAGNOSIS — H10413 Chronic giant papillary conjunctivitis, bilateral: Secondary | ICD-10-CM | POA: Diagnosis not present

## 2022-04-24 DIAGNOSIS — H2513 Age-related nuclear cataract, bilateral: Secondary | ICD-10-CM | POA: Diagnosis not present

## 2022-04-24 DIAGNOSIS — G35 Multiple sclerosis: Secondary | ICD-10-CM | POA: Diagnosis not present

## 2022-04-24 DIAGNOSIS — H43391 Other vitreous opacities, right eye: Secondary | ICD-10-CM | POA: Diagnosis not present

## 2022-04-25 ENCOUNTER — Other Ambulatory Visit: Payer: BC Managed Care – PPO

## 2022-04-27 DIAGNOSIS — D229 Melanocytic nevi, unspecified: Secondary | ICD-10-CM | POA: Diagnosis not present

## 2022-04-27 DIAGNOSIS — L570 Actinic keratosis: Secondary | ICD-10-CM | POA: Diagnosis not present

## 2022-04-27 DIAGNOSIS — Z85820 Personal history of malignant melanoma of skin: Secondary | ICD-10-CM | POA: Diagnosis not present

## 2022-04-27 DIAGNOSIS — L578 Other skin changes due to chronic exposure to nonionizing radiation: Secondary | ICD-10-CM | POA: Diagnosis not present

## 2022-04-27 DIAGNOSIS — H903 Sensorineural hearing loss, bilateral: Secondary | ICD-10-CM | POA: Diagnosis not present

## 2022-05-02 ENCOUNTER — Ambulatory Visit: Payer: BC Managed Care – PPO

## 2022-05-02 DIAGNOSIS — R079 Chest pain, unspecified: Secondary | ICD-10-CM | POA: Diagnosis not present

## 2022-05-02 DIAGNOSIS — E782 Mixed hyperlipidemia: Secondary | ICD-10-CM

## 2022-05-02 DIAGNOSIS — R0609 Other forms of dyspnea: Secondary | ICD-10-CM | POA: Diagnosis not present

## 2022-05-04 ENCOUNTER — Telehealth: Payer: Self-pay

## 2022-05-04 ENCOUNTER — Other Ambulatory Visit: Payer: BC Managed Care – PPO

## 2022-05-04 NOTE — Telephone Encounter (Signed)
-----   Message from Dousman, DO sent at 05/03/2022  9:36 AM EST ----- normal

## 2022-05-04 NOTE — Telephone Encounter (Signed)
LMTCB

## 2022-05-04 NOTE — Progress Notes (Signed)
Called patient to inform her about her stress test results.

## 2022-05-09 ENCOUNTER — Ambulatory Visit: Payer: BC Managed Care – PPO

## 2022-05-09 DIAGNOSIS — R079 Chest pain, unspecified: Secondary | ICD-10-CM | POA: Diagnosis not present

## 2022-05-09 DIAGNOSIS — E782 Mixed hyperlipidemia: Secondary | ICD-10-CM

## 2022-05-09 DIAGNOSIS — R0609 Other forms of dyspnea: Secondary | ICD-10-CM | POA: Diagnosis not present

## 2022-05-10 DIAGNOSIS — E559 Vitamin D deficiency, unspecified: Secondary | ICD-10-CM | POA: Diagnosis not present

## 2022-05-10 DIAGNOSIS — Z79899 Other long term (current) drug therapy: Secondary | ICD-10-CM | POA: Diagnosis not present

## 2022-05-10 DIAGNOSIS — E538 Deficiency of other specified B group vitamins: Secondary | ICD-10-CM | POA: Diagnosis not present

## 2022-05-10 DIAGNOSIS — G35 Multiple sclerosis: Secondary | ICD-10-CM | POA: Diagnosis not present

## 2022-05-11 NOTE — Progress Notes (Signed)
Patient aware.

## 2022-05-11 NOTE — Progress Notes (Signed)
normal

## 2022-05-16 DIAGNOSIS — E7800 Pure hypercholesterolemia, unspecified: Secondary | ICD-10-CM | POA: Diagnosis not present

## 2022-05-17 DIAGNOSIS — K3189 Other diseases of stomach and duodenum: Secondary | ICD-10-CM | POA: Diagnosis not present

## 2022-05-17 DIAGNOSIS — R110 Nausea: Secondary | ICD-10-CM | POA: Diagnosis not present

## 2022-05-17 DIAGNOSIS — R1310 Dysphagia, unspecified: Secondary | ICD-10-CM | POA: Diagnosis not present

## 2022-05-22 DIAGNOSIS — E875 Hyperkalemia: Secondary | ICD-10-CM | POA: Diagnosis not present

## 2022-05-28 ENCOUNTER — Encounter: Payer: Self-pay | Admitting: Internal Medicine

## 2022-05-28 ENCOUNTER — Ambulatory Visit: Payer: BC Managed Care – PPO | Admitting: Internal Medicine

## 2022-05-28 VITALS — BP 108/64 | HR 70 | Ht 65.0 in | Wt 134.0 lb

## 2022-05-28 DIAGNOSIS — R079 Chest pain, unspecified: Secondary | ICD-10-CM | POA: Diagnosis not present

## 2022-05-28 DIAGNOSIS — E782 Mixed hyperlipidemia: Secondary | ICD-10-CM

## 2022-05-28 NOTE — Progress Notes (Signed)
Primary Physician/Referring:  Antony Contras, MD  Patient ID: Melissa Hicks, female    DOB: 1960/10/31, 62 y.o.   MRN: [id number]  Chief Complaint  Patient presents with   Chest Pain   Follow-up   Results   HPI:    Melissa Hicks  is a 62 y.o. female with hyperlipidemia and multiple sclerosis who is here for a follow-up visit. She has been doing well since the last time she was here. Patient saw her neurologist recently and one of her MS meds were stopped and they believe this was causing her chest pain and shortness of breath. Since stopping that MS drug, patient has not had any further episodes of chest pain and shortness of breath. She denies palpitations, diaphoresis, syncope, orthopnea, edema, claudication, PND.  Past Medical History:  Diagnosis Date   Hyperlipidemia    Melanoma ([place]) 09/26/2020   mm level !!- right upper back-(forsyth plastic)   Squamous cell carcinoma of skin 02/07/2021   in situ- right forearm-anterior (CX35FU)   History reviewed. No pertinent surgical history. Family History  Problem Relation Age of Onset   Ovarian cancer Mother    Aplastic anemia Father     Social History   Tobacco Use   Smoking status: Never   Smokeless tobacco: Never  Substance Use Topics   Alcohol use: Never   Marital Status: Married  ROS  Review of Systems  Cardiovascular:  Negative for chest pain and dyspnea on exertion.  Respiratory:  Negative for shortness of breath.    Objective  Blood pressure 108/64, pulse 70, height '5\' 5"'$  (1.651 m), weight 134 lb (60.8 kg), SpO2 100 %. Body mass index is 22.3 kg/m.     05/28/2022   11:34 AM 04/16/2022    2:14 PM  Vitals with BMI  Height '5\' 5"'$  '5\' 5"'$   Weight 134 lbs 132 lbs 3 oz  BMI 41.2 22  Systolic 878 676  Diastolic 64 70  Pulse 70 95     Physical Exam Vitals reviewed.  HENT:     Head: Normocephalic and atraumatic.  Cardiovascular:     Rate and Rhythm: Normal rate and regular rhythm.     Pulses: Normal pulses.      Heart sounds: Normal heart sounds. No murmur heard. Pulmonary:     Effort: Pulmonary effort is normal.     Breath sounds: Normal breath sounds.  Abdominal:     General: Bowel sounds are normal.  Musculoskeletal:     Right lower leg: No edema.     Left lower leg: No edema.  Skin:    General: Skin is warm and dry.  Neurological:     Mental Status: She is alert.     Medications and allergies   Allergies  Allergen Reactions   Depakote [Divalproex Sodium] Anxiety     Medication list after today's encounter   Current Outpatient Medications:    ALPRAZolam (XANAX) 1 MG tablet, Take by mouth., Disp: , Rfl:    aspirin 81 MG chewable tablet, Chew by mouth., Disp: , Rfl:    carbamazepine (TEGRETOL) 200 MG tablet, Take by mouth., Disp: , Rfl:    cyclobenzaprine (FLEXERIL) 10 MG tablet, Take 10 mg by mouth 3 (three) times daily as needed., Disp: , Rfl:    estradiol (VIVELLE-DOT) 0.05 MG/24HR patch, APPLY 1 PATCH TWICE A WEEK, Disp: , Rfl:    gabapentin (NEURONTIN) 300 MG capsule, Take by mouth., Disp: , Rfl:    Melatonin 3 MG CAPS, Take 1 capsule by  mouth at bedtime., Disp: , Rfl:    ondansetron (ZOFRAN) 4 MG tablet, Take 4 mg by mouth every 8 (eight) hours as needed for nausea or vomiting., Disp: , Rfl:    pantoprazole (PROTONIX) 40 MG tablet, Take 40 mg by mouth every morning., Disp: , Rfl:    rosuvastatin (CRESTOR) 10 MG tablet, Take 10 mg by mouth daily., Disp: , Rfl:   Laboratory examination:   No results found for: "NA", "K", "CO2", "GLUCOSE", "BUN", "CREATININE", "CALCIUM", "EGFR", "GFRNONAA"      No data to display             No data to display          Lipid Panel No results for input(s): "CHOL", "TRIG", "LDLCALC", "VLDL", "HDL", "CHOLHDL", "LDLDIRECT" in the last 8760 hours.  HEMOGLOBIN A1C No results found for: "HGBA1C", "MPG" TSH No results for input(s): "TSH" in the last 8760 hours.  External labs:     Radiology:    Cardiac Studies:   Exercise  Sestamibi stress test 05/02/2022: Exercise nuclear stress test was performed using Bruce protocol.   1 Day Rest and Stress images. Exercise time 5 minutes 11 seconds, achieved 7.05 METS, 87% APMHR.  Stress ECG negative for ischemia. Normal myocardial perfusion without convincing evidence of reversible ischemia or prior infarct..   Left ventricular size normal, calculated LVEF 64%, visually hyperdynamic. Low risk study. No prior studies available for comparison.   Echocardiogram 05/09/2022:   Normal LV systolic function with visual EF 60-65%. Left ventricle cavity is normal in size. Normal left ventricular wall thickness. Normal global wall motion. Normal diastolic filling pattern, normal LAP. Calculated EF 64%. Structurally normal mitral valve.  Mild (Grade I) mitral regurgitation. Structurally normal tricuspid valve.  Mild tricuspid regurgitation. No evidence of pulmonary hypertension. No prior available for comparison.    EKG:   04/16/2022: Sinus Rhythm. Low voltage.  -Nonspecific ST depression   Assessment     ICD-10-CM   1. Mixed hyperlipidemia  E78.2     2. Chest pain  R07.9        No orders of the defined types were placed in this encounter.   No orders of the defined types were placed in this encounter.   Medications Discontinued During This Encounter  Medication Reason   ZEPOSIA 0.92 MG CAPS Patient Preference     Recommendations:   Melissa Hicks is a 62 y.o.  female with chest pain   Chest pain Resolved after neurology has stopped the MS drug they believe was causing this Stress test and echocardiogram within normal limits Patient to follow-up on as needed basis.   Mixed hyperlipidemia Continue statin Followed by PCP     Floydene Flock, DO, [facility]  05/28/2022, 12:08 PM Office: [phone] Pager: [phone]

## 2022-07-17 DIAGNOSIS — D649 Anemia, unspecified: Secondary | ICD-10-CM | POA: Diagnosis not present

## 2022-07-18 DIAGNOSIS — Z79899 Other long term (current) drug therapy: Secondary | ICD-10-CM | POA: Diagnosis not present

## 2022-07-18 DIAGNOSIS — G35 Multiple sclerosis: Secondary | ICD-10-CM | POA: Diagnosis not present

## 2022-07-18 DIAGNOSIS — E538 Deficiency of other specified B group vitamins: Secondary | ICD-10-CM | POA: Diagnosis not present

## 2022-07-18 DIAGNOSIS — R413 Other amnesia: Secondary | ICD-10-CM | POA: Diagnosis not present

## 2022-08-01 ENCOUNTER — Ambulatory Visit: Payer: BC Managed Care – PPO | Admitting: Dermatology

## 2022-08-06 DIAGNOSIS — D649 Anemia, unspecified: Secondary | ICD-10-CM | POA: Diagnosis not present

## 2022-09-10 DIAGNOSIS — Z23 Encounter for immunization: Secondary | ICD-10-CM | POA: Diagnosis not present

## 2022-09-10 DIAGNOSIS — Z0000 Encounter for general adult medical examination without abnormal findings: Secondary | ICD-10-CM | POA: Diagnosis not present

## 2022-10-10 DIAGNOSIS — L578 Other skin changes due to chronic exposure to nonionizing radiation: Secondary | ICD-10-CM | POA: Diagnosis not present

## 2022-10-10 DIAGNOSIS — Z85828 Personal history of other malignant neoplasm of skin: Secondary | ICD-10-CM | POA: Diagnosis not present

## 2022-10-10 DIAGNOSIS — D229 Melanocytic nevi, unspecified: Secondary | ICD-10-CM | POA: Diagnosis not present

## 2022-10-10 DIAGNOSIS — Z85820 Personal history of malignant melanoma of skin: Secondary | ICD-10-CM | POA: Diagnosis not present

## 2022-11-02 DIAGNOSIS — G501 Atypical facial pain: Secondary | ICD-10-CM | POA: Diagnosis not present

## 2022-11-02 DIAGNOSIS — Z79899 Other long term (current) drug therapy: Secondary | ICD-10-CM | POA: Diagnosis not present

## 2022-11-02 DIAGNOSIS — G35 Multiple sclerosis: Secondary | ICD-10-CM | POA: Diagnosis not present

## 2022-11-02 DIAGNOSIS — G43909 Migraine, unspecified, not intractable, without status migrainosus: Secondary | ICD-10-CM | POA: Diagnosis not present

## 2022-11-26 DIAGNOSIS — Z23 Encounter for immunization: Secondary | ICD-10-CM | POA: Diagnosis not present

## 2022-12-03 DIAGNOSIS — G35 Multiple sclerosis: Secondary | ICD-10-CM | POA: Diagnosis not present

## 2022-12-04 ENCOUNTER — Ambulatory Visit: Payer: BC Managed Care – PPO | Admitting: Cardiology

## 2022-12-04 ENCOUNTER — Encounter: Payer: Self-pay | Admitting: Cardiology

## 2022-12-04 VITALS — BP 107/65 | HR 86 | Resp 16 | Ht 65.0 in | Wt 128.6 lb

## 2022-12-04 DIAGNOSIS — G35D Multiple sclerosis, unspecified: Secondary | ICD-10-CM

## 2022-12-04 DIAGNOSIS — R072 Precordial pain: Secondary | ICD-10-CM

## 2022-12-04 DIAGNOSIS — G35 Multiple sclerosis: Secondary | ICD-10-CM | POA: Diagnosis not present

## 2022-12-04 DIAGNOSIS — E782 Mixed hyperlipidemia: Secondary | ICD-10-CM | POA: Diagnosis not present

## 2022-12-04 NOTE — Progress Notes (Signed)
ID:  Melissa Hicks, DOB December 28, 1960, MRN [id number]  PCP:  Tally Joe, MD  Cardiologist:  Tessa Lerner, DO, [facility] (established care 12/04/22) Former Cardiology Providers: Dr. Clotilde Dieter   Chief Complaint  Patient presents with   Mixed hyperlipidemia    HPI  Melissa Hicks is a 62 y.o. Caucasian female whose past medical history and cardiovascular risk factors include: Hyperlipidemia, multiple sclerosis.  Patient was formally seen by my former partner Dr. Rozell Searing Custovic and I am seeing her for the first time.  Based on the last office note it appears that she was on a medication for multiple sclerosis which may be the offending agent causing her prior chest pain.  After stopping the medication her symptoms had essentially resolved.  However during the workup she did undergo echo and stress test which are outlined below.  She presents today for follow-up.  Since last office visit patient has started new medication for multiple sclerosis and is doing well.  She has not experienced any recurrence of chest pain.  At times she has noted difficulty in breathing but she is attributing that to also her underlying anxiety.  She denies any heart failure symptoms.  FUNCTIONAL STATUS: No structured exercise program or daily routine.   CARDIAC DATABASE: EKG: December 04, 2022: Sinus rhythm, 82 bpm, low voltage, without underlying ischemia injury pattern.  Echocardiogram: 05/09/2022: Normal LV systolic function with visual EF 60-65%. Left ventricle cavity is normal in size. Normal left ventricular wall thickness. Normal global wall motion. Normal diastolic filling pattern, normal LAP. Calculated EF 64%. Structurally normal mitral valve.  Mild (Grade I) mitral regurgitation. Structurally normal tricuspid valve.  Mild tricuspid regurgitation. No evidence of pulmonary hypertension. No prior available for comparison.   Stress Testing: Exercise Sestamibi stress test 05/02/2022: Exercise nuclear  stress test was performed using Bruce protocol. 1 Day Rest and Stress images. Exercise time 5 minutes 11 seconds, achieved 7.05 METS, 87% APMHR. Stress ECG negative for ischemia. Normal myocardial perfusion without convincing evidence of reversible ischemia or prior infarct.. Left ventricular size normal, calculated LVEF 64%, visually hyperdynamic. Low risk study. No prior studies available for comparison.   ALLERGIES: Allergies  Allergen Reactions   Depakote [Divalproex Sodium] Anxiety    MEDICATION LIST PRIOR TO VISIT: Current Meds  Medication Sig   ALPRAZolam (XANAX) 1 MG tablet Take by mouth.   amphetamine-dextroamphetamine (ADDERALL) 10 MG tablet Take 1 tablet by mouth daily.   aspirin 81 MG chewable tablet Chew by mouth.   carbamazepine (TEGRETOL) 200 MG tablet Take by mouth.   cyclobenzaprine (FLEXERIL) 10 MG tablet Take 10 mg by mouth 3 (three) times daily as needed.   Diroximel Fumarate (VUMERITY) 231 MG CPDR Take 2 tablets by mouth in the morning and at bedtime.   estradiol (VIVELLE-DOT) 0.05 MG/24HR patch APPLY 1 PATCH TWICE A WEEK   gabapentin (NEURONTIN) 300 MG capsule Take by mouth.   Melatonin 3 MG CAPS Take 1 capsule by mouth at bedtime.   ondansetron (ZOFRAN) 4 MG tablet Take 4 mg by mouth every 8 (eight) hours as needed for nausea or vomiting.   pantoprazole (PROTONIX) 40 MG tablet Take 40 mg by mouth every morning.   rosuvastatin (CRESTOR) 10 MG tablet Take 10 mg by mouth daily.     PAST MEDICAL HISTORY: Past Medical History:  Diagnosis Date   Hyperlipidemia    Melanoma ([facility]) 09/26/2020   mm level !!- right upper back-(forsyth plastic)   Squamous cell carcinoma of skin 02/07/2021   in  situ- right forearm-anterior (CX35FU)    PAST SURGICAL HISTORY: History reviewed. No pertinent surgical history.  FAMILY HISTORY: The patient family history includes Aplastic anemia in her father; Ovarian cancer in her mother.  SOCIAL HISTORY:  The patient  reports  that she has never smoked. She has never used smokeless tobacco. She reports that she does not drink alcohol and does not use drugs.  REVIEW OF SYSTEMS: Review of Systems  Cardiovascular:  Negative for chest pain, claudication, dyspnea on exertion, irregular heartbeat, leg swelling, near-syncope, orthopnea, palpitations, paroxysmal nocturnal dyspnea and syncope.  Respiratory:  Negative for shortness of breath.   Hematologic/Lymphatic: Negative for bleeding problem.  Musculoskeletal:  Negative for muscle cramps and myalgias.  Neurological:  Negative for dizziness and light-headedness.    PHYSICAL EXAM:    12/04/2022    9:27 AM 05/28/2022   11:34 AM 04/16/2022    2:14 PM  Vitals with BMI  Height 5\' 5"  5\' 5"  5\' 5"   Weight 128 lbs 10 oz 134 lbs 132 lbs 3 oz  BMI 21.4 22.3 22  Systolic 107 108 409  Diastolic 65 64 70  Pulse 86 70 95    Physical Exam  Constitutional: No distress.  Age appropriate, hemodynamically stable.   Neck: No JVD present.  Cardiovascular: Normal rate, regular rhythm, S1 normal, S2 normal, intact distal pulses and normal pulses. Exam reveals no gallop, no S3 and no S4.  No murmur heard. Pulmonary/Chest: Effort normal and breath sounds normal. No stridor. She has no wheezes. She has no rales.  Abdominal: Soft. Bowel sounds are normal. She exhibits no distension. There is no abdominal tenderness.  Musculoskeletal:        General: No edema.     Cervical back: Neck supple.  Neurological: She is alert and oriented to person, place, and time. She has intact cranial nerves (2-12).  Skin: Skin is warm and moist.     LABORATORY DATA: External Labs: Collected: May 07, 2022. Total cholesterol 233, triglycerides 107, HDL 84, LDL calculated 131, non-HDL 149.  Collected: November 02, 2022 available in Care Everywhere. Sodium 134, potassium 5.2, chloride 101, bicarb 27. BUN 10, creatinine 0.7. eGFR >90. AST 14, ALT 13, alkaline phosphatase 84 Hemoglobin 11.8,  hematocrit 35.2%   IMPRESSION:    ICD-10-CM   1. Precordial pain  R07.2     2. Mixed hyperlipidemia  E78.2 EKG 12-Lead    3. Multiple sclerosis (HCC)  G35        RECOMMENDATIONS: Melissa Hicks is a 62 y.o. Caucasian female whose past medical history and cardiac risk factors include: Hyperlipidemia, multiple sclerosis.  Precordial pain Remains asymptomatic since last office visit. Has undergone appropriate cardiovascular workup including echo and stress test. EKG today is nonischemic Educated her on the importance of improving her modifiable cardiovascular risk factors. Monitor for now  Mixed hyperlipidemia Currently on rosuvastatin. Fasting lipids from January 2024 independently reviewed. LDL is acceptable but not at goal-she will work with PCP with regards to further up titration of medical therapy if needed.  Multiple sclerosis (HCC) Follows with neurology.   FINAL MEDICATION LIST END OF ENCOUNTER: No orders of the defined types were placed in this encounter.   There are no discontinued medications.   Current Outpatient Medications:    ALPRAZolam (XANAX) 1 MG tablet, Take by mouth., Disp: , Rfl:    amphetamine-dextroamphetamine (ADDERALL) 10 MG tablet, Take 1 tablet by mouth daily., Disp: , Rfl:    aspirin 81 MG chewable tablet, Chew by mouth., Disp: ,  Rfl:    carbamazepine (TEGRETOL) 200 MG tablet, Take by mouth., Disp: , Rfl:    cyclobenzaprine (FLEXERIL) 10 MG tablet, Take 10 mg by mouth 3 (three) times daily as needed., Disp: , Rfl:    Diroximel Fumarate (VUMERITY) 231 MG CPDR, Take 2 tablets by mouth in the morning and at bedtime., Disp: , Rfl:    estradiol (VIVELLE-DOT) 0.05 MG/24HR patch, APPLY 1 PATCH TWICE A WEEK, Disp: , Rfl:    gabapentin (NEURONTIN) 300 MG capsule, Take by mouth., Disp: , Rfl:    Melatonin 3 MG CAPS, Take 1 capsule by mouth at bedtime., Disp: , Rfl:    ondansetron (ZOFRAN) 4 MG tablet, Take 4 mg by mouth every 8 (eight) hours as needed for  nausea or vomiting., Disp: , Rfl:    pantoprazole (PROTONIX) 40 MG tablet, Take 40 mg by mouth every morning., Disp: , Rfl:    rosuvastatin (CRESTOR) 10 MG tablet, Take 10 mg by mouth daily., Disp: , Rfl:   Orders Placed This Encounter  Procedures   EKG 12-Lead    There are no Patient Instructions on file for this visit.   --Continue cardiac medications as reconciled in final medication list. --Return in about 1 year (around 12/04/2023) for Annual follow up visit. or sooner if needed. --Continue follow-up with your primary care physician regarding the management of your other chronic comorbid conditions.  Patient's questions and concerns were addressed to her satisfaction. She voices understanding of the instructions provided during this encounter.   This note was created using a voice recognition software as a result there may be grammatical errors inadvertently enclosed that do not reflect the nature of this encounter. Every attempt is made to correct such errors.  Tessa Lerner, [place], [facility]  Pager:  [phone] Office: [phone]

## 2022-12-17 DIAGNOSIS — H903 Sensorineural hearing loss, bilateral: Secondary | ICD-10-CM | POA: Diagnosis not present

## 2022-12-17 DIAGNOSIS — L570 Actinic keratosis: Secondary | ICD-10-CM | POA: Diagnosis not present

## 2022-12-25 DIAGNOSIS — C4359 Malignant melanoma of other part of trunk: Secondary | ICD-10-CM | POA: Diagnosis not present

## 2023-02-01 DIAGNOSIS — G43909 Migraine, unspecified, not intractable, without status migrainosus: Secondary | ICD-10-CM | POA: Diagnosis not present

## 2023-02-01 DIAGNOSIS — Z79899 Other long term (current) drug therapy: Secondary | ICD-10-CM | POA: Diagnosis not present

## 2023-02-01 DIAGNOSIS — G501 Atypical facial pain: Secondary | ICD-10-CM | POA: Diagnosis not present

## 2023-02-01 DIAGNOSIS — G35 Multiple sclerosis: Secondary | ICD-10-CM | POA: Diagnosis not present

## 2023-03-28 DIAGNOSIS — Z01411 Encounter for gynecological examination (general) (routine) with abnormal findings: Secondary | ICD-10-CM | POA: Diagnosis not present

## 2023-03-28 DIAGNOSIS — K649 Unspecified hemorrhoids: Secondary | ICD-10-CM | POA: Diagnosis not present

## 2023-03-28 DIAGNOSIS — Z8262 Family history of osteoporosis: Secondary | ICD-10-CM | POA: Diagnosis not present

## 2023-03-28 DIAGNOSIS — Z808 Family history of malignant neoplasm of other organs or systems: Secondary | ICD-10-CM | POA: Diagnosis not present

## 2023-04-11 DIAGNOSIS — Z872 Personal history of diseases of the skin and subcutaneous tissue: Secondary | ICD-10-CM | POA: Diagnosis not present

## 2023-04-11 DIAGNOSIS — L578 Other skin changes due to chronic exposure to nonionizing radiation: Secondary | ICD-10-CM | POA: Diagnosis not present

## 2023-04-11 DIAGNOSIS — Z85820 Personal history of malignant melanoma of skin: Secondary | ICD-10-CM | POA: Diagnosis not present

## 2023-04-11 DIAGNOSIS — Z85828 Personal history of other malignant neoplasm of skin: Secondary | ICD-10-CM | POA: Diagnosis not present

## 2023-04-15 DIAGNOSIS — Z1231 Encounter for screening mammogram for malignant neoplasm of breast: Secondary | ICD-10-CM | POA: Diagnosis not present

## 2023-04-29 DIAGNOSIS — G35 Multiple sclerosis: Secondary | ICD-10-CM | POA: Diagnosis not present

## 2023-04-29 DIAGNOSIS — K581 Irritable bowel syndrome with constipation: Secondary | ICD-10-CM | POA: Diagnosis not present

## 2023-04-29 DIAGNOSIS — Z79899 Other long term (current) drug therapy: Secondary | ICD-10-CM | POA: Diagnosis not present

## 2023-06-13 DIAGNOSIS — Z780 Asymptomatic menopausal state: Secondary | ICD-10-CM | POA: Diagnosis not present

## 2023-06-13 DIAGNOSIS — Z8262 Family history of osteoporosis: Secondary | ICD-10-CM | POA: Diagnosis not present

## 2023-06-13 DIAGNOSIS — M8589 Other specified disorders of bone density and structure, multiple sites: Secondary | ICD-10-CM | POA: Diagnosis not present

## 2023-07-29 DIAGNOSIS — Z79899 Other long term (current) drug therapy: Secondary | ICD-10-CM | POA: Diagnosis not present

## 2023-07-29 DIAGNOSIS — M5416 Radiculopathy, lumbar region: Secondary | ICD-10-CM | POA: Diagnosis not present

## 2023-07-30 DIAGNOSIS — Z79899 Other long term (current) drug therapy: Secondary | ICD-10-CM | POA: Diagnosis not present

## 2023-08-22 IMAGING — MR MR HEAD WO/W CM
13 series · 48 of 48 positions shown · IV contrast (multihance)
Comparison: MRI of the brain November 19, 2011

CLINICAL DATA: Multiple sclerosis.

EXAM:
MRI HEAD WITHOUT AND WITH CONTRAST
TECHNIQUE: Multiplanar, multiecho pulse sequences of the brain and surrounding
structures were obtained without and with intravenous contrast.
CONTRAST:  12mL MULTIHANCE GADOBENATE DIMEGLUMINE 529 MG/ML IV SOLN

[Series 2: T1 · sagittal · 5.0mm · 0.45mm/px · 2 of 22 slices shown]
[im 1/22]
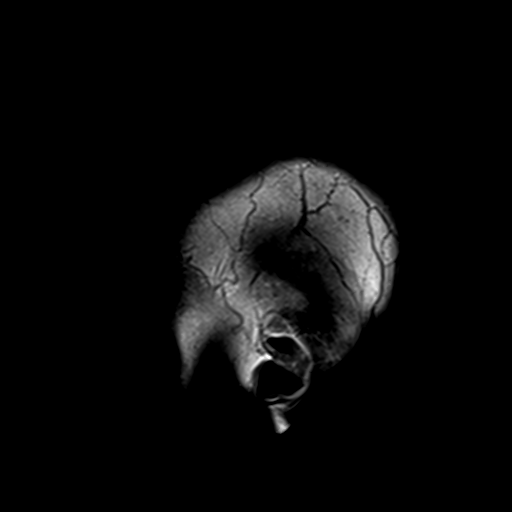
[im 22/22]
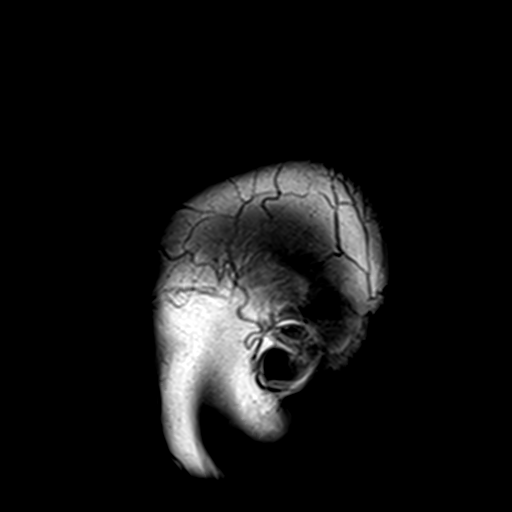

[Series 3: DWI · axial · 3.0mm · 1.80mm/px · z∈[-67,+86]mm · 7 of 103 slices shown (1 of 4)]
[im 1/103]
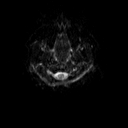
[im 18/103]
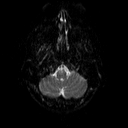
[im 35/103]
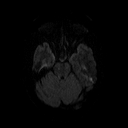
[im 52/103]
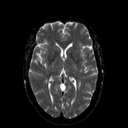
[im 69/103]
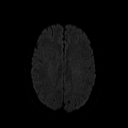
[im 86/103]
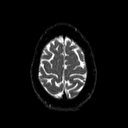
[im 103/103]
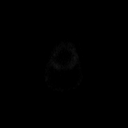

[Series 4: DWI · axial · 3.0mm · 1.80mm/px · z∈[-67,+86]mm · 3 of 50 slices shown (2 of 4)]
[im 1/50]
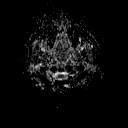
[im 25/50]
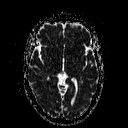
[im 50/50]
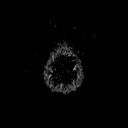

[Series 5: DWI · coronal · 5.0mm · 1.80mm/px · 5 of 76 slices shown (3 of 4)]
[im 1/76]
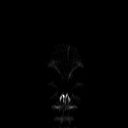
[im 19/76]
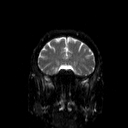
[im 38/76]
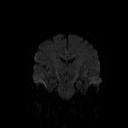
[im 57/76]
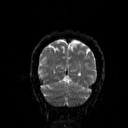
[im 76/76]
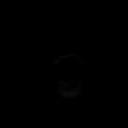

[Series 6: DWI · coronal · 5.0mm · 1.80mm/px · 2 of 38 slices shown (4 of 4)]
[im 1/38]
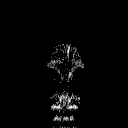
[im 38/38]
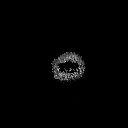

[Series 7: T2 · axial · 5.0mm · 0.60mm/px · 1 of 23 slices shown (1 of 2)]
[im 1/23]
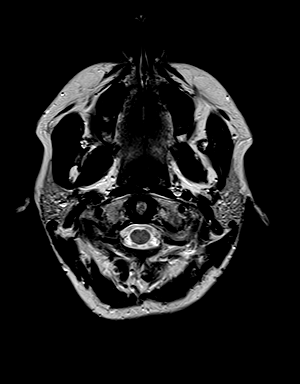

[Series 8: FLAIR · axial · 3.0mm · 0.45mm/px · z∈[-62,+82]mm · 2 of 32 slices shown (1 of 2)]
[im 1/32]
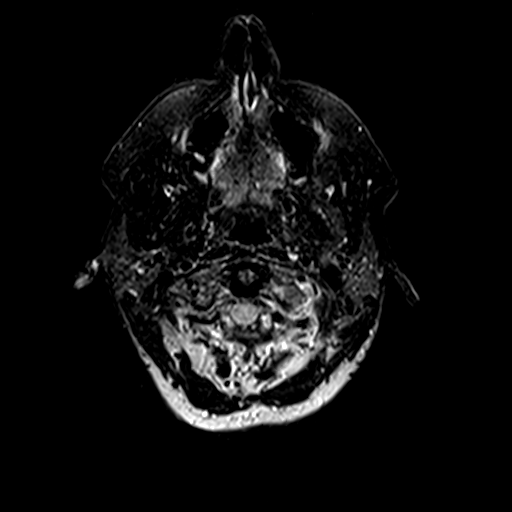
[im 32/32]
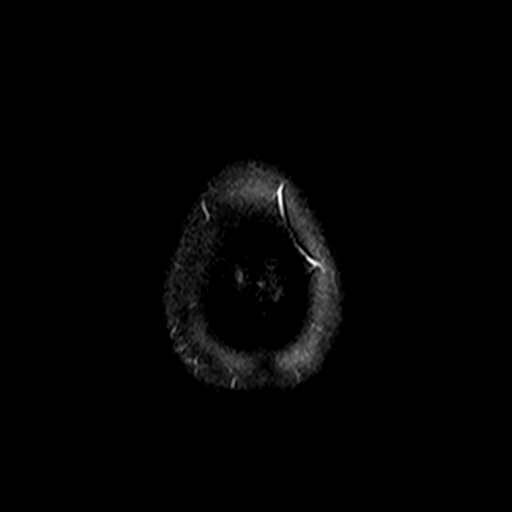

[Series 10: swi_images · axial · 4.0mm · 0.90mm/px · z∈[-68,+88]mm · 2 of 40 slices shown]
[im 1/40]
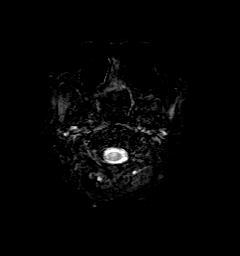
[im 40/40]
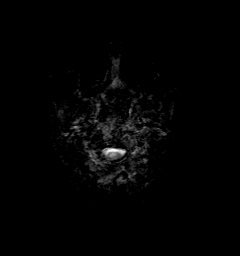

[Series 11: t1_mpr_tra · axial · 1.0mm · 0.75mm/px · z∈[-61,+82]mm · 9 of 144 slices shown]
[im 1/144]
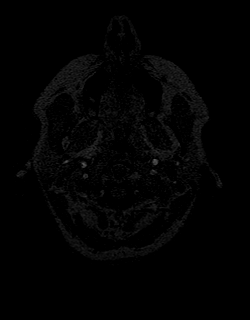
[im 18/144]
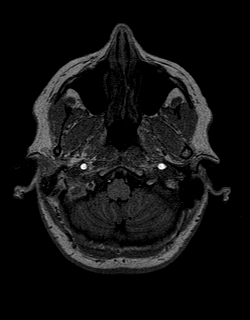
[im 36/144]
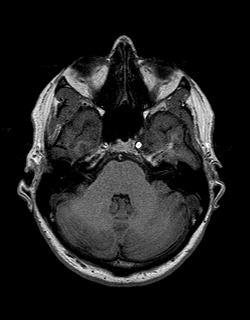
[im 54/144]
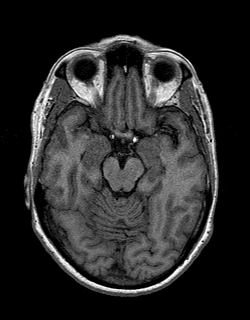
[im 72/144]
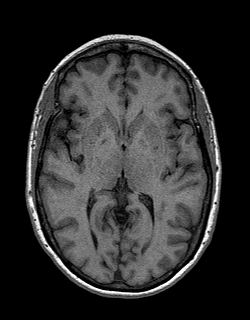
[im 90/144]
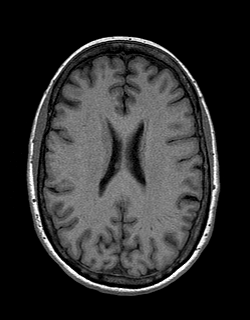
[im 108/144]
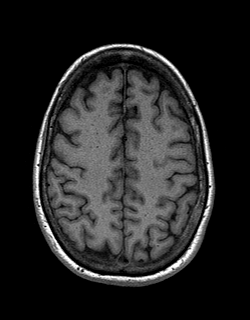
[im 126/144]
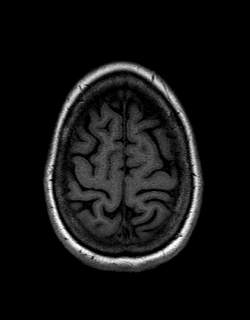
[im 144/144]
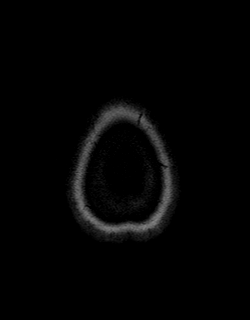

[Series 12: FLAIR · sagittal · 5.0mm · 0.45mm/px · 2 of 26 slices shown (2 of 2)]
[im 1/26]
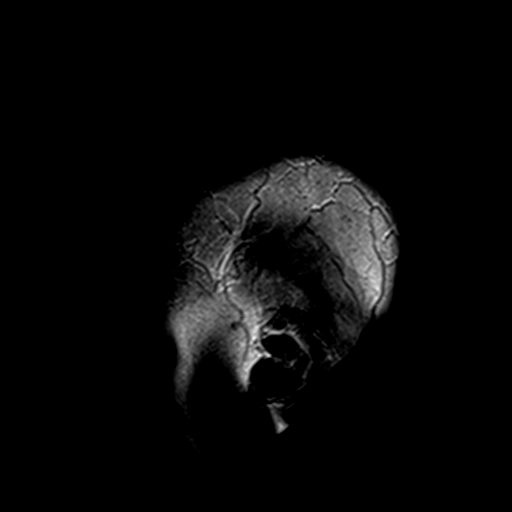
[im 26/26]
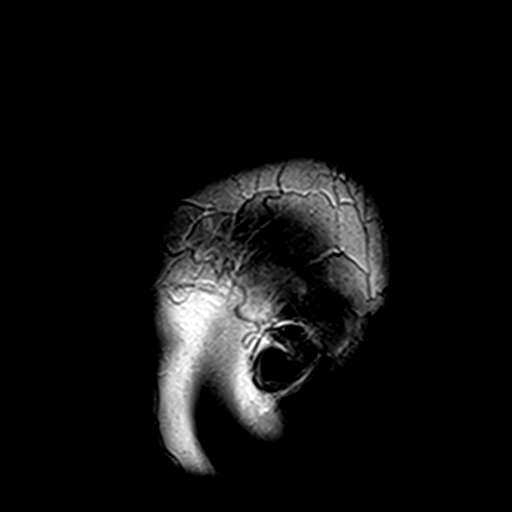

[Series 13: T2 · coronal · 5.0mm · 0.45mm/px · 2 of 37 slices shown (2 of 2)]
[im 1/37]
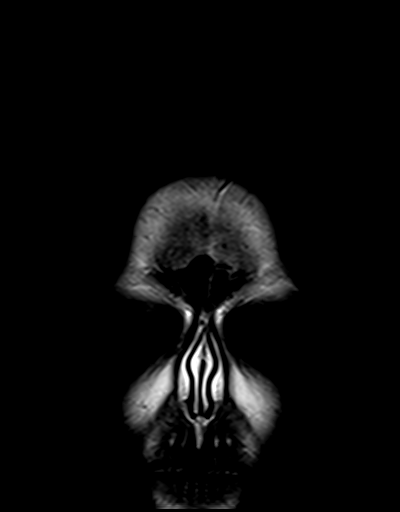
[im 37/37]
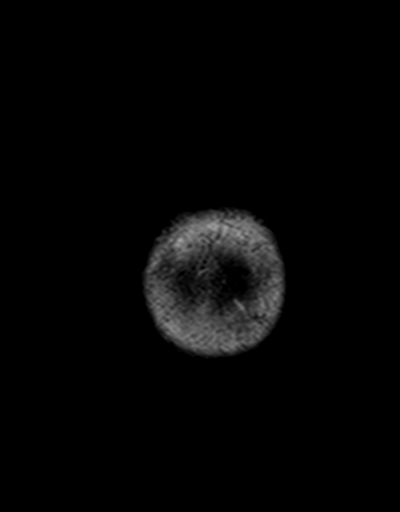

[Series 14: t1_mpr_tra post · axial · 1.0mm · 0.75mm/px · z∈[-61,+82]mm · 9 of 144 slices shown]
[im 1/144]
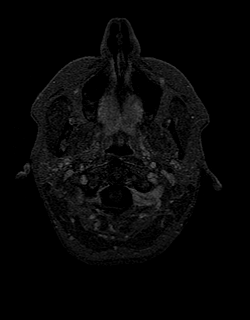
[im 18/144]
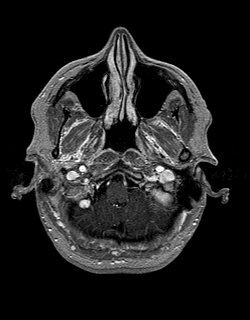
[im 36/144]
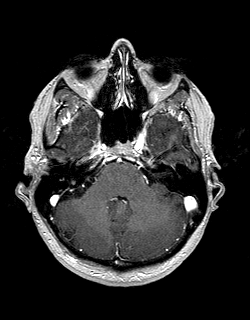
[im 54/144]
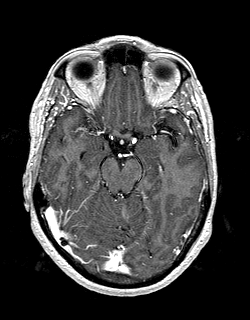
[im 72/144]
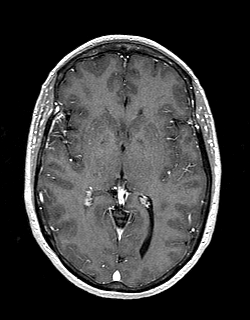
[im 90/144]
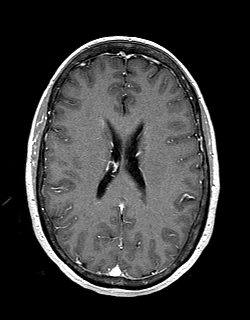
[im 108/144]
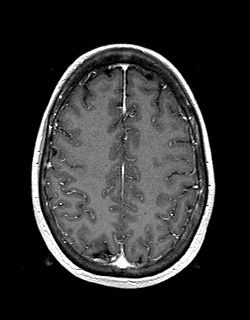
[im 126/144]
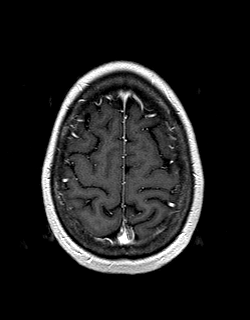
[im 144/144]
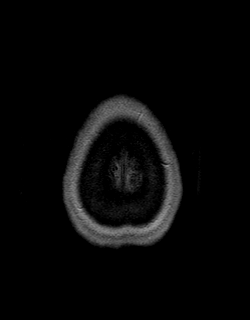

[Series 15: post cor · coronal · 5.0mm · 0.45mm/px · 2 of 37 slices shown]
[im 1/37]
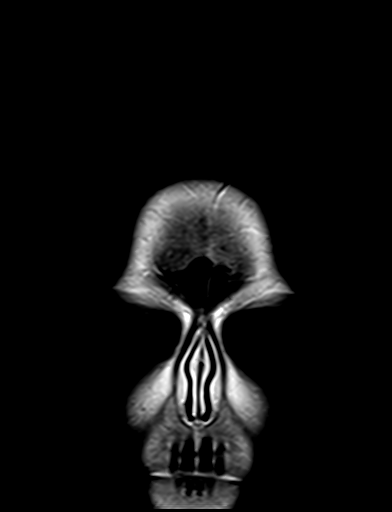
[im 37/37]
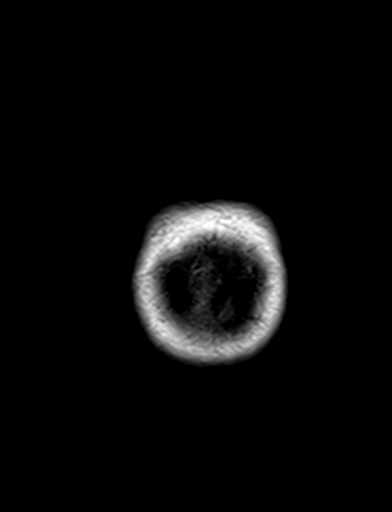

[48 of 48 positions shown; findings below may reference images not displayed]

FINDINGS: Brain: No acute infarction, hemorrhage, hydrocephalus, extra-axial
collection or mass lesion. Scattered foci of T2 hyperintensity are
seen within the white matter of the cerebral hemispheres, including
deep, periventricular and juxtacortical white matter. No posterior
fossa lesion identified. No enhancing lesions seen to suggest active
demyelination. When compared to prior study, at least 3 new lesions
are seen in the right frontal lobe in 1 lesion in the left frontal
lobe. One lesion in each frontal lobe appear increased in size.

Vascular: Normal flow voids.

Skull and upper cervical spine: Normal marrow signal.

Sinuses/Orbits: Negative.

Other: None.
IMPRESSION: Small amount of nonspecific T2 hyperintense lesions of the white
matter, consistent with the clinical diagnosis of multiple
sclerosis. Few new lesions are seen when compared to prior MRI
performed 5461. No enhancing lesion identified to suggest active
demyelination.

## 2023-11-27 ENCOUNTER — Other Ambulatory Visit: Payer: Self-pay | Admitting: Family Medicine

## 2023-11-27 DIAGNOSIS — M25562 Pain in left knee: Secondary | ICD-10-CM

## 2023-11-28 ENCOUNTER — Ambulatory Visit
Admission: RE | Admit: 2023-11-28 | Discharge: 2023-11-28 | Disposition: A | Discharge status: Home / Self Care | Source: Ambulatory Visit | Attending: Family Medicine | Admitting: Family Medicine

## 2023-11-28 DIAGNOSIS — M25562 Pain in left knee: Secondary | ICD-10-CM

## 2023-12-05 ENCOUNTER — Ambulatory Visit: Payer: Self-pay | Admitting: Cardiology

## 2024-01-22 ENCOUNTER — Other Ambulatory Visit (INDEPENDENT_AMBULATORY_CARE_PROVIDER_SITE_OTHER): Payer: Self-pay

## 2024-01-22 ENCOUNTER — Ambulatory Visit: Admitting: Orthopaedic Surgery

## 2024-01-22 DIAGNOSIS — M25562 Pain in left knee: Secondary | ICD-10-CM | POA: Diagnosis not present

## 2024-01-22 DIAGNOSIS — G8929 Other chronic pain: Secondary | ICD-10-CM

## 2024-01-22 NOTE — Progress Notes (Signed)
 Office Visit Note   Patient: Melissa Hicks           Date of Birth: 09/29/1960           MRN: [id number] Visit Date: 01/22/2024              Requested by: Seabron Lenis, MD [phone] [address] [place],  [place] [address] PCP: Seabron Lenis, MD   Assessment & Plan: Visit Diagnoses:  1. Chronic pain of left knee     Plan: History of Present Illness Melissa Hicks is a 63 year old female with multiple sclerosis who presents with left knee pain. She was referred by Dr. Jeralyn for evaluation of her left knee pain.  She has experienced left knee pain for approximately a year, following a fall where her left leg went behind her and the right leg extended forward. The pain is throbbing and localized around the kneecap, with a severity of 6 to 7 out of 10 on painful days, though she sometimes has pain-free days. She has not previously sought medical evaluation or treatment for this issue.  She has not participated in physical therapy for her knee. There have been no recent changes in her activity level or weight. However, her walking distance has decreased from five to seven miles a day to two miles a day since receiving a COVID booster in 2021, partly due to issues with her left foot related to multiple sclerosis.  Pain is occasional.    Physical Exam MUSCULOSKELETAL: No joint swelling, normal ligament exam, and no crepitus in the left knee.  Results RADIOLOGY Left knee X-ray: No significant degenerative arthritis, no spurring, joint spaces well preserved, good space between femur and tibia under patella. Left knee MRI: Reactive cysts, early arthritic changes, bone marrow edema, non-weight bearing portion of femur affected (11/2023).  Assessment and Plan Left knee pain with bone edema and early arthritic changes Intermittent left knee pain exacerbated by a previous fall. MRI shows bone edema and reactive cysts, indicating early arthritic changes. No significant degenerative arthritis  on x-ray. Symptoms likely due to cystic changes and bone edema, resembling a bone bruise. Condition expected to resolve over 6-9 months. Multiple sclerosis complicates symptomatology and activity level. - Recommend physical therapy or trainer for quadriceps strengthening. - Consider cortisone injection if pain becomes severe, though efficacy may be limited. - Suggest kinesio tape around patella for support. - Advise Voltaren gel for topical pain relief. - Provide home exercises for knee strengthening. - Consider compression knee brace during activity.  Follow-Up Instructions: No follow-ups on file.   Orders:  Orders Placed This Encounter  Procedures   XR KNEE 3 VIEW LEFT   No orders of the defined types were placed in this encounter.     Procedures: No procedures performed   Clinical Data: No additional findings.   Subjective: Chief Complaint  Patient presents with   Left Knee - Pain    HPI  Review of Systems  Constitutional: Negative.   HENT: Negative.    Eyes: Negative.   Respiratory: Negative.    Cardiovascular: Negative.   Endocrine: Negative.   Musculoskeletal: Negative.   Neurological: Negative.   Hematological: Negative.   Psychiatric/Behavioral: Negative.    All other systems reviewed and are negative.    Objective: Vital Signs: There were no vitals taken for this visit.  Physical Exam Vitals and nursing note reviewed.  Constitutional:      Appearance: She is well-developed.  HENT:  Head: Atraumatic.     Nose: Nose normal.  Eyes:     Extraocular Movements: Extraocular movements intact.  Cardiovascular:     Pulses: Normal pulses.  Pulmonary:     Effort: Pulmonary effort is normal.  Abdominal:     Palpations: Abdomen is soft.  Musculoskeletal:     Cervical back: Neck supple.  Skin:    General: Skin is warm.     Capillary Refill: Capillary refill takes less than 2 seconds.  Neurological:     Mental Status: She is alert. Mental status  is at baseline.  Psychiatric:        Behavior: Behavior normal.        Thought Content: Thought content normal.        Judgment: Judgment normal.     Ortho Exam  Specialty Comments:  No specialty comments available.  Imaging: XR KNEE 3 VIEW LEFT Result Date: 01/22/2024 X-rays of the left knee show no acute or structural abnormalities    PMFS History: Patient Active Problem List   Diagnosis Date Noted   DOE (dyspnea on exertion) 04/16/2022   Mixed hyperlipidemia 04/16/2022   Chest pain 04/16/2022   Past Medical History:  Diagnosis Date   Hyperlipidemia    Melanoma ([facility]) 09/26/2020   mm level !!- right upper back-(forsyth plastic)   Squamous cell carcinoma of skin 02/07/2021   in situ- right forearm-anterior (CX35FU)    Family History  Problem Relation Age of Onset   Ovarian cancer Mother    Aplastic anemia Father     No past surgical history on file. Social History   Occupational History   Not on file  Tobacco Use   Smoking status: Never   Smokeless tobacco: Never  Vaping Use   Vaping status: Never Used  Substance and Sexual Activity   Alcohol use: Never   Drug use: Never   Sexual activity: Not on file

## 2024-01-28 ENCOUNTER — Ambulatory Visit: Attending: Cardiology | Admitting: Cardiology

## 2024-01-28 ENCOUNTER — Encounter: Payer: Self-pay | Admitting: Cardiology

## 2024-01-28 VITALS — BP 120/76 | HR 82 | Resp 16 | Ht 65.0 in | Wt 132.0 lb

## 2024-01-28 DIAGNOSIS — E782 Mixed hyperlipidemia: Secondary | ICD-10-CM | POA: Diagnosis present

## 2024-01-28 DIAGNOSIS — G35 Multiple sclerosis: Secondary | ICD-10-CM | POA: Diagnosis not present

## 2024-01-28 DIAGNOSIS — R072 Precordial pain: Secondary | ICD-10-CM | POA: Insufficient documentation

## 2024-01-28 DIAGNOSIS — E781 Pure hyperglyceridemia: Secondary | ICD-10-CM | POA: Diagnosis not present

## 2024-01-28 NOTE — Patient Instructions (Signed)
 Medication Instructions:  The current medical regimen is effective;  continue present plan and medications.  *If you need a refill on your cardiac medications before your next appointment, please call your pharmacy*  Follow-Up: At [facility], you and your health needs are our priority.  As part of our continuing mission to provide you with exceptional heart care, our providers are all part of one team.  This team includes your primary Cardiologist (physician) and Advanced Practice Providers or APPs (Physician Assistants and Nurse Practitioners) who all work together to provide you with the care you need, when you need it.  Your next appointment:   Follow up as needed.

## 2024-01-28 NOTE — Progress Notes (Signed)
 Cardiology Office Note:  .   Date:  01/28/2024  ID:  Melissa Hicks, DOB [birth year], MRN [id number] PCP:  Seabron Lenis, MD  Former Cardiology Providers: Dr. Annalee Casa [facility] Providers Cardiologist:  Melissa Large, DO , [facility] (established care 12/04/2022) Electrophysiologist:  None  Click to update primary MD,subspecialty MD or APP then REFRESH:1}    Chief Complaint  Patient presents with   Precordial pain   Follow-up    History of Present Illness: .   Melissa Hicks is a 63 y.o. Caucasian female whose past medical history and cardiovascular risk factors includes: Hyperlipidemia, multiple sclerosis.   Patient presents today for 19-month follow-up visit.  Since last office visit patient denies anginal chest pain or heart failure symptoms. Overall function capacity remains relatively stable, still ambulates couple miles twice a week (overall functional capacity limited due to foot discomfort).   Most recent lipid profile reviewed -LDL and triglyceride levels are not well-controlled.  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  She is cognizant of her diet as well as recent labs and working towards improving them with PCP  Review of Systems: .   Review of Systems  Cardiovascular:  Negative for chest pain, claudication, irregular heartbeat, leg swelling, near-syncope, orthopnea, palpitations, paroxysmal nocturnal dyspnea and syncope.  Respiratory:  Negative for shortness of breath.   Hematologic/Lymphatic: Negative for bleeding problem.    Studies Reviewed:   EKG: EKG Interpretation Date/Time:  Tuesday January 28 2024 08:39:47 EDT Ventricular Rate:  82 PR Interval:  122 QRS Duration:  64 QT Interval:  346 QTC Calculation: 404 R Axis:   64  Text Interpretation: Normal sinus rhythm Low voltage QRS No previous ECGs available Confirmed by Hicks Melissa [phone]) on 01/28/2024 9:08:30  AM  Echocardiogram: 05/09/2022: Normal LV systolic function with visual EF 60-65%. Left ventricle cavity is normal in size. Normal left ventricular wall thickness. Normal global wall motion. Normal diastolic filling pattern, normal LAP. Calculated EF 64%. Structurally normal mitral valve.  Mild (Grade I) mitral regurgitation. Structurally normal tricuspid valve.  Mild tricuspid regurgitation. No evidence of pulmonary hypertension. No prior available for comparison.    Stress Testing: Exercise Sestamibi stress test 05/02/2022: Exercise nuclear stress test was performed using Bruce protocol. 1 Day Rest and Stress images. Exercise time 5 minutes 11 seconds, achieved 7.05 METS, 87% APMHR. Stress ECG negative for ischemia. Normal myocardial perfusion without convincing evidence of reversible ischemia or prior infarct.. Left ventricular size normal, calculated LVEF 64%, visually hyperdynamic. Low risk study. No prior studies available for comparison.   RADIOLOGY: NA  Risk Assessment/Calculations:   NA   Labs:        External Labs: Collected: May 07, 2022. Total cholesterol 233, triglycerides 107, HDL 84, LDL calculated 131, non-HDL 149.   Collected: November 02, 2022 available in Care Everywhere. Sodium 134, potassium 5.2, chloride 101, bicarb 27. BUN 10, creatinine 0.7. eGFR >90. AST 14, ALT 13, alkaline phosphatase 84 Hemoglobin 11.8, hematocrit 35.2%  External Labs: Collected: November 05, 2023 [facility] database. Total cholesterol 269, triglycerides 169, HDL 70, LDL 168. Hemoglobin 10.8. Potassium 4.4. TSH 3.65  Physical Exam:    Today's Vitals   01/28/24 0837  BP: 120/76  Pulse: 82  Resp: 16  SpO2: 99%  Weight: 132 lb (59.9 kg)  Height: 5' 5 (1.651 m)   Body mass index is 21.97 kg/m. Wt Readings from Last 3 Encounters:  01/28/24 132 lb (59.9 kg)  12/04/22 128 lb 9.6 oz (58.3 kg)  05/28/22 134 lb (60.8 kg)    Physical Exam  Constitutional: No distress.   hemodynamically stable  Neck: No JVD present.  Cardiovascular: Normal rate, regular rhythm, S1 normal and S2 normal. Exam reveals no gallop, no S3 and no S4.  No murmur heard. Pulmonary/Chest: Effort normal and breath sounds normal. No stridor. She has no wheezes. She has no rales.  Musculoskeletal:        General: No edema.     Cervical back: Neck supple.  Skin: Skin is warm.     Impression & Recommendation(s):  Impression:   ICD-10-CM   1. Precordial pain  R07.2 EKG 12-Lead    2. Mixed hyperlipidemia  E78.2     3. Hypertriglyceridemia  E78.1     4. Multiple sclerosis  G35        Recommendation(s):  Precordial pain No reoccurrence since last office visit. EKG nonischemic. Prior exercise nuclear stress test from December 2023 overall low risk Reemphasized the importance of prevention with focus on improving the modifiable cardiovascular risk factors such as glycemic control, lipid management. Discontinue aspirin 81 mg p.o. daily, no true cardiovascular indication at this time No additional workup warranted in an asymptomatic female Will see her back on appearing basis, patient agreeable with the plan of care  Mixed hyperlipidemia Hypertriglyceridemia Most recent lipid profile from July 2025 independently reviewed and mentioned above for reference. Currently on Crestor 10 mg p.o. daily. We discussed plant-based diet as well as DASH diet  Recommended that she has her repeat fasting lipids with PCP to make sure she is on appropriate pharmacological therapy.   Orders Placed:  Orders Placed This Encounter  Procedures   EKG 12-Lead     Final Medication List:   No orders of the defined types were placed in this encounter.   Medications Discontinued During This Encounter  Medication Reason   amphetamine-dextroamphetamine (ADDERALL) 10 MG tablet Patient Preference     Current Outpatient Medications:    ALPRAZolam (XANAX) 1 MG tablet, Take by mouth., Disp: , Rfl:     aspirin 81 MG chewable tablet, Chew by mouth., Disp: , Rfl:    carbamazepine (TEGRETOL) 200 MG tablet, Take by mouth., Disp: , Rfl:    cyclobenzaprine (FLEXERIL) 10 MG tablet, Take 10 mg by mouth 3 (three) times daily as needed., Disp: , Rfl:    Diroximel Fumarate (VUMERITY) 231 MG CPDR, Take 2 tablets by mouth in the morning and at bedtime., Disp: , Rfl:    estradiol (VIVELLE-DOT) 0.05 MG/24HR patch, APPLY 1 PATCH TWICE A WEEK, Disp: , Rfl:    gabapentin (NEURONTIN) 300 MG capsule, Take by mouth., Disp: , Rfl:    Melatonin 3 MG CAPS, Take 1 capsule by mouth at bedtime., Disp: , Rfl:    ondansetron (ZOFRAN) 4 MG tablet, Take 4 mg by mouth every 8 (eight) hours as needed for nausea or vomiting., Disp: , Rfl:    pantoprazole (PROTONIX) 40 MG tablet, Take 40 mg by mouth every morning., Disp: , Rfl:    rosuvastatin (CRESTOR) 10 MG tablet, Take 10 mg by mouth daily., Disp: , Rfl:   Consent:   NA  Disposition:   As needed  Her questions and concerns were addressed to her satisfaction. She voices understanding of the recommendations provided during this encounter.    Signed, Melissa Michele HAS, [facility]  A Division of  [facility] [address], [place] [address], [place] [address]  01/28/2024  7:56 PM

## 2024-03-09 ENCOUNTER — Encounter: Payer: Self-pay | Admitting: Radiology
